# Patient Record
Sex: Female | Born: 1985 | Race: White | Hispanic: No | Marital: Married | Smoking: Never smoker
Health system: Southern US, Community
[De-identification: ages and names within clinical notes are randomized; demographics above are authoritative.]

## PROBLEM LIST (undated history)

## (undated) HISTORY — PX: APPENDECTOMY: SHX54

## (undated) HISTORY — PX: ABLATION: SHX5711

---

## 2013-05-15 ENCOUNTER — Encounter: Payer: Self-pay | Admitting: Family Medicine

## 2013-05-15 ENCOUNTER — Encounter (INDEPENDENT_AMBULATORY_CARE_PROVIDER_SITE_OTHER): Payer: Self-pay

## 2013-05-15 ENCOUNTER — Ambulatory Visit (INDEPENDENT_AMBULATORY_CARE_PROVIDER_SITE_OTHER): Payer: 59 | Admitting: Family Medicine

## 2013-05-15 VITALS — BP 106/73 | HR 88 | Ht 70.0 in | Wt 140.0 lb

## 2013-05-15 DIAGNOSIS — M545 Low back pain, unspecified: Secondary | ICD-10-CM

## 2013-05-15 DIAGNOSIS — M549 Dorsalgia, unspecified: Secondary | ICD-10-CM

## 2013-05-15 NOTE — Patient Instructions (Signed)
You have musculoskeletal low back pain, possibly a bulging disc on the right side. Take tylenol for baseline pain relief (1-2 extra strength tabs 3x/day) Other medications I'd advise against (could take a pain medicine if this is severe). No prednisone, aleve, ibuprofen products.  Some muscle relaxants can be considered but typically don't improve the course. Stay as active as possible. Do home exercises and stretches as directed - hold each for 20-30 seconds and do each one three times. Start physical therapy. Strengthening of low back muscles, abdominal musculature are key for long term pain relief. If not improving, will consider further imaging (MRI). Follow up with me in 6 weeks.

## 2013-05-17 ENCOUNTER — Ambulatory Visit: Payer: 59 | Attending: Family Medicine | Admitting: Physical Therapy

## 2013-05-17 DIAGNOSIS — IMO0001 Reserved for inherently not codable concepts without codable children: Secondary | ICD-10-CM | POA: Insufficient documentation

## 2013-05-17 DIAGNOSIS — M549 Dorsalgia, unspecified: Secondary | ICD-10-CM | POA: Insufficient documentation

## 2013-05-20 ENCOUNTER — Ambulatory Visit: Payer: 59 | Admitting: Physical Therapy

## 2013-05-20 ENCOUNTER — Encounter: Payer: Self-pay | Admitting: Family Medicine

## 2013-05-20 DIAGNOSIS — M545 Low back pain, unspecified: Secondary | ICD-10-CM | POA: Insufficient documentation

## 2013-05-20 NOTE — Progress Notes (Signed)
Patient ID: Ashley Whitaker, female   DOB: Feb 12, 1986, 28 y.o.   MRN: [id number]  PCP: No primary provider on file.  Subjective:   HPI: Patient is a 28 y.o. female here for low back pain.  Patient reports pain started about 2 years ago when she was pregnant with her daughter. Developed pain in lower back that would shoot up at times. Improved after delivery but not completely. Tried ice, heat, ibuprofen. Pain worse regardless of position. Had to stop running due to pain. Occasionally into right leg. Occasional numbness and tingling there also. No bowel/bladder dysfunction.  History reviewed. No pertinent past medical history.  No current outpatient prescriptions on file prior to visit.   No current facility-administered medications on file prior to visit.    Past Surgical History  Procedure Laterality Date  . Appendectomy      No Known Allergies  History   Social History  . Marital Status: Married    Spouse Name: N/A    Number of Children: N/A  . Years of Education: N/A   Occupational History  . Not on file.   Social History Main Topics  . Smoking status: Never Smoker   . Smokeless tobacco: Not on file  . Alcohol Use: Not on file  . Drug Use: Not on file  . Sexual Activity: Not on file   Other Topics Concern  . Not on file   Social History Narrative  . No narrative on file    Family History  Problem Relation Age of Onset  . Hyperlipidemia Father   . Hypertension Father   . Diabetes Neg Hx   . Heart attack Neg Hx   . Sudden death Neg Hx     BP 106/73  Pulse 88  Ht 5\' 10"  (1.778 m)  Wt 140 lb (63.504 kg)  BMI 20.09 kg/m2  Review of Systems: See HPI above.    Objective:  Physical Exam:  Gen: NAD  Back: No gross deformity, scoliosis. No TTP.  No midline or bony TTP. FROM with pain on flexion. Strength LEs 5/5 all muscle groups.   2+ MSRs in patellar and achilles tendons, equal bilaterally. Negative SLRs. Sensation intact to light  touch bilaterally. Negative logroll bilateral hips Negative fabers and piriformis stretches.    Assessment & Plan:  1. Low back pain - chronic.  Currently [redacted] weeks pregnant so medication options are limited.  Will not go ahead with radiographs either.  Start with physical therapy, tylenol as needed.  Consider MRI if not improving.  F/u in 4-6 weeks.

## 2013-05-20 NOTE — Assessment & Plan Note (Signed)
chronic.  Currently [redacted] weeks pregnant so medication options are limited.  Will not go ahead with radiographs either.  Start with physical therapy, tylenol as needed.  Consider MRI if not improving.  F/u in 4-6 weeks.

## 2013-05-22 ENCOUNTER — Encounter: Payer: 59 | Admitting: Physical Therapy

## 2013-05-24 ENCOUNTER — Encounter: Payer: 59 | Admitting: Physical Therapy

## 2013-05-27 ENCOUNTER — Encounter: Payer: 59 | Admitting: Physical Therapy

## 2013-05-29 ENCOUNTER — Encounter: Payer: 59 | Admitting: Physical Therapy

## 2013-05-31 ENCOUNTER — Encounter: Payer: 59 | Admitting: Physical Therapy

## 2013-06-03 ENCOUNTER — Encounter: Payer: 59 | Admitting: Physical Therapy

## 2013-06-05 ENCOUNTER — Encounter: Payer: 59 | Admitting: Physical Therapy

## 2013-06-07 ENCOUNTER — Encounter: Payer: 59 | Admitting: Physical Therapy

## 2013-06-10 ENCOUNTER — Encounter: Payer: 59 | Admitting: Physical Therapy

## 2013-06-12 ENCOUNTER — Encounter: Payer: 59 | Admitting: Physical Therapy

## 2013-06-13 ENCOUNTER — Encounter: Payer: 59 | Admitting: Physical Therapy

## 2013-06-17 ENCOUNTER — Encounter: Payer: 59 | Admitting: Physical Therapy

## 2013-06-19 ENCOUNTER — Encounter: Payer: 59 | Admitting: Physical Therapy

## 2013-06-21 ENCOUNTER — Encounter: Payer: 59 | Admitting: Physical Therapy

## 2013-07-01 ENCOUNTER — Ambulatory Visit: Payer: 59 | Admitting: Family Medicine

## 2016-12-12 ENCOUNTER — Ambulatory Visit (INDEPENDENT_AMBULATORY_CARE_PROVIDER_SITE_OTHER): Payer: 59 | Admitting: Family Medicine

## 2016-12-12 ENCOUNTER — Ambulatory Visit (HOSPITAL_BASED_OUTPATIENT_CLINIC_OR_DEPARTMENT_OTHER)
Admission: RE | Admit: 2016-12-12 | Discharge: 2016-12-12 | Disposition: A | Discharge status: Home / Self Care | Payer: 59 | Source: Ambulatory Visit | Attending: Family Medicine | Admitting: Family Medicine

## 2016-12-12 ENCOUNTER — Encounter: Payer: Self-pay | Admitting: Family Medicine

## 2016-12-12 VITALS — BP 103/68 | HR 67 | Ht 70.0 in | Wt 142.0 lb

## 2016-12-12 DIAGNOSIS — M79671 Pain in right foot: Secondary | ICD-10-CM | POA: Diagnosis not present

## 2016-12-12 NOTE — Patient Instructions (Signed)
You have an intermetatarsal sprain. Your x-rays and ultrasound are reassuring. These can take anywhere from a few days up to 6 weeks to resolve though I'd expect you to be better over the next 2-3 weeks. Postop shoe or cam walker when up and walking around for comfort - can switch to comfortable shoe when tolerated. Icing 15 minutes at a time 3-4 times a day. Tylenol and/or aleve as needed for pain. Follow up with me in 3-4 weeks if you're still having problems.

## 2016-12-14 DIAGNOSIS — C439 Malignant melanoma of skin, unspecified: Secondary | ICD-10-CM | POA: Insufficient documentation

## 2016-12-14 DIAGNOSIS — M79671 Pain in right foot: Secondary | ICD-10-CM | POA: Insufficient documentation

## 2016-12-14 DIAGNOSIS — N870 Mild cervical dysplasia: Secondary | ICD-10-CM | POA: Insufficient documentation

## 2016-12-14 NOTE — Assessment & Plan Note (Signed)
unusual mechanism.  Ordered and ndependently reviewed radiographs, performed and reviewed ultrasound - no evidence abnormalities.  Consistent with intermetatarsal sprain.  Icing, tylenol and/or aleve.  Postop shoe for comfort.  F/u in 3-4 weeks if not improving as expected.

## 2016-12-14 NOTE — Progress Notes (Signed)
PCP: Patient, No Pcp Per  Subjective:   HPI: Patient is a 31 y.o. female here for right foot pain.  Patient reports Sunday morning she went to stand up from seated position and felt a sharp pain on bottom of her right foot about the 1st and 2nd metatarsal head areas. 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 Pain level up to 4-5/10. She is limping, pain is sharp like stepping on a knife. She has been icing and taking ibuprofen which help. No skin changes, bruising, numbness. Some swelling however.  No past medical history on file.  No current outpatient prescriptions on file prior to visit.   No current facility-administered medications on file prior to visit.     Past Surgical History:  Procedure Laterality Date  . APPENDECTOMY      Allergies  Allergen Reactions  . Chlorophyll Hives, Nausea And Vomiting and Swelling  . Citric Acid Hives, Rash and Swelling  . Lidocaine   . Sulfa Antibiotics     Social History   Social History  . Marital status: Married    Spouse name: N/A  . Number of children: N/A  . Years of education: N/A   Occupational History  . Not on file.   Social History Main Topics  . Smoking status: Never Smoker  . Smokeless tobacco: Never Used  . Alcohol use Not on file  . Drug use: Unknown  . Sexual activity: Not on file   Other Topics Concern  . Not on file   Social History Narrative  . No narrative on file    Family History  Problem Relation Age of Onset  . Hyperlipidemia Father   . Hypertension Father   . Diabetes Neg Hx   . Heart attack Neg Hx   . Sudden death Neg Hx     BP 103/68   Pulse 67   Ht 5\' 10"  (1.778 m)   Wt 142 lb (64.4 kg)   LMP 12/07/2016   BMI 20.37 kg/m   Review of Systems: See HPI above.     Objective:  Physical Exam:  Gen: NAD, comfortable in exam room  Right foot/ankle: No gross deformity, swelling, ecchymoses FROM with pain on passive extension and  flexion of great toe. TTP plantar 1st, 2nd metatarsal heads and over sesamoids. Pain metatarsal squeeze. Negative ant drawer and talar tilt.   Negative syndesmotic compression. Thompsons test negative. NV intact distally.  Left foot/ankle: FROM without pain.   MSK u/s right foot:  No evidence cortical irregularity, edema.  No target sign or abnormalities of flexor hallucis tendon.  Assessment & Plan:  1. Right foot injury - unusual mechanism.  Ordered and ndependently reviewed radiographs, performed and reviewed ultrasound - no evidence abnormalities.  Consistent with intermetatarsal sprain.  Icing, tylenol and/or aleve.  Postop shoe for comfort.  F/u in 3-4 weeks if not improving as expected.

## 2017-05-25 ENCOUNTER — Ambulatory Visit (INDEPENDENT_AMBULATORY_CARE_PROVIDER_SITE_OTHER): Payer: 59 | Admitting: Family Medicine

## 2017-05-25 ENCOUNTER — Encounter: Payer: Self-pay | Admitting: Family Medicine

## 2017-05-25 DIAGNOSIS — M79672 Pain in left foot: Secondary | ICD-10-CM | POA: Diagnosis not present

## 2017-05-25 DIAGNOSIS — M79644 Pain in right finger(s): Secondary | ICD-10-CM

## 2017-05-25 DIAGNOSIS — M79645 Pain in left finger(s): Secondary | ICD-10-CM | POA: Diagnosis not present

## 2017-05-25 NOTE — Patient Instructions (Signed)
You have metatarsalgia of your left foot. Use metatarsal pads in your workout shoes. Avoid flat shoes, barefoot walking if this worsens your pain. If you have more problems on the right side just call and we can put the pad on the right too, no charge. Your ultrasound of the fingers is reassuring - consistent with scar tissue on the left, tenosynovitis on the right. Icing, ibuprofen or aleve if needed. Call me if the swelling worsens. Follow up with me as needed otherwise.

## 2017-05-28 ENCOUNTER — Encounter: Payer: Self-pay | Admitting: Family Medicine

## 2017-05-28 DIAGNOSIS — M79672 Pain in left foot: Secondary | ICD-10-CM | POA: Insufficient documentation

## 2017-05-28 DIAGNOSIS — M79644 Pain in right finger(s): Secondary | ICD-10-CM | POA: Insufficient documentation

## 2017-05-28 DIAGNOSIS — M79645 Pain in left finger(s): Secondary | ICD-10-CM

## 2017-05-28 NOTE — Assessment & Plan Note (Signed)
2/2 metatarsalgia.  MSK u/s reassuring.  Metatarsal pad placed in insert.  Avoid flat shoes, barefoot walking.  Tylenol and/or ibuprofen if needed.

## 2017-05-28 NOTE — Assessment & Plan Note (Signed)
consistent with scar tissue on left, small focal tenosynovitis on right.  Reassured.  Icing, ibuprofen or aleve if needed.

## 2017-05-28 NOTE — Progress Notes (Signed)
PCP: Patient, No Pcp Per  Subjective:   HPI: Patient is a 32 y.o. female here for left foot pain, finger pain.  Patient reports for the last few weeks she's had worsening plantar left foot pain in forefoot. Pain is currently 2/10 but up to 8-9/10 and sharp at times. Worse with running, step exercises. Taking ibuprofen which helps some. No skin changes, numbness. Also reporting she's noticed small knots on dorsal aspect of PIPs of right ring and left index fingers.  Pain is mild and 2-3/10. Worse with striking these areas on anything.  History reviewed. No pertinent past medical history.  Current Outpatient Medications on File Prior to Visit  Medication Sig Dispense Refill  . fluticasone (FLONASE) 50 MCG/ACT nasal spray fluticasone propionate 50 mcg/actuation nasal spray,suspension    . levonorgestrel (MIRENA, 52 MG,) 20 MCG/24HR IUD Mirena 20 mcg/24 hours (5 yrs) 52 mg intrauterine device  Take 1 device by intrauterine route.    . Multiple Vitamin (MULTIVITAMIN) tablet Take by mouth.     No current facility-administered medications on file prior to visit.     Past Surgical History:  Procedure Laterality Date  . APPENDECTOMY      Allergies  Allergen Reactions  . Chlorophyll Hives, Nausea And Vomiting and Swelling  . Citric Acid Hives, Rash and Swelling  . Lidocaine   . Sulfa Antibiotics     Social History   Socioeconomic History  . Marital status: Married    Spouse name: Not on file  . Number of children: Not on file  . Years of education: Not on file  . Highest education level: Not on file  Social Needs  . Financial resource strain: Not on file  . Food insecurity - worry: Not on file  . Food insecurity - inability: Not on file  . Transportation needs - medical: Not on file  . Transportation needs - non-medical: Not on file  Occupational History  . Not on file  Tobacco Use  . Smoking status: Never Smoker  . Smokeless tobacco: Never Used  Substance and Sexual  Activity  . Alcohol use: Not on file  . Drug use: Not on file  . Sexual activity: Not on file  Other Topics Concern  . Not on file  Social History Narrative  . Not on file    Family History  Problem Relation Age of Onset  . Hyperlipidemia Father   . Hypertension Father   . Diabetes Neg Hx   . Heart attack Neg Hx   . Sudden death Neg Hx     BP 113/86   Pulse 72   Ht 5\' 10"  (1.778 m)   Wt 150 lb (68 kg)   BMI 21.52 kg/m   Review of Systems: See HPI above.     Objective:  Physical Exam:  Gen: NAD, comfortable in exam room  Left foot/ankle: No gross deformity, swelling, ecchymoses FROM with 5/5 strength without pain on flexion/extension of ankle or digits. TTP plantar 2nd MT head.  No other tenderness. Negative ant drawer and talar tilt.   Negative syndesmotic compression. Negative metatarsal squeeze. Thompsons test negative. NV intact distally.  Right foot/ankle: No deformity. FROM with 5/5 strength. No tenderness to palpation. NVI distally.  Right 4th digit and left 2nd digit: No malrotation or angulation.  Small palpable nonmobile nodules noted dorsal aspect on right 4th PIP and left 2nd PIP.   Both areas mildly tender.  No erythema, warmth, synovitis. No movement of nodules with flexion/extension.   MSK u/s  left foot:  No cortical irregularities, edema overlying cortices of 1st-3rd metatarsals.    MSK u/s left index finger: no abnormalities in area of swelling.  MSK u/s right ring finger: small anechoic area in area of swelling within extensor tendon sheath consistent with tenosynovitis.  No neovascularity.  Assessment & Plan:  1. Left foot pain - 2/2 metatarsalgia.  MSK u/s reassuring.  Metatarsal pad placed in insert.  Avoid flat shoes, barefoot walking.  Tylenol and/or ibuprofen if needed.    2. Finger pain - consistent with scar tissue on left, small focal tenosynovitis on right.  Reassured.  Icing, ibuprofen or aleve if needed.

## 2017-12-05 ENCOUNTER — Encounter: Payer: Self-pay | Admitting: Family Medicine

## 2017-12-05 ENCOUNTER — Ambulatory Visit (INDEPENDENT_AMBULATORY_CARE_PROVIDER_SITE_OTHER): Payer: 59 | Admitting: Family Medicine

## 2017-12-05 VITALS — BP 111/78 | HR 63 | Ht 70.0 in | Wt 148.0 lb

## 2017-12-05 DIAGNOSIS — M25571 Pain in right ankle and joints of right foot: Secondary | ICD-10-CM | POA: Diagnosis not present

## 2017-12-05 DIAGNOSIS — G8929 Other chronic pain: Secondary | ICD-10-CM | POA: Diagnosis not present

## 2017-12-05 DIAGNOSIS — M25572 Pain in left ankle and joints of left foot: Secondary | ICD-10-CM | POA: Diagnosis not present

## 2017-12-05 NOTE — Patient Instructions (Signed)
You have ankle instability that's causing mild tendon strains about your ankles. Icing 15 minutes at a time, 3-4 times a day as needed Ibuprofen 3 tabs every 6 hours as needed with food. Tylenol 500mg  1-2 tabs every 6 hours as needed. You can take both of these about 30-60 minutes before the hike then again about halfway through. Start theraband strengthening exercises - once a day 3 sets of 10 - these are the most important part of your treatment. Laceup brace is a consideration but will try to do without this given your issues with metatarsalgia. Consider physical therapy for strengthening and balance exercises. Follow up in 5-6 weeks.

## 2017-12-06 ENCOUNTER — Encounter: Payer: Self-pay | Admitting: Family Medicine

## 2017-12-06 NOTE — Progress Notes (Signed)
PCP: Patient, No Pcp Per  Subjective:   HPI: Patient is a 32 y.o. female here for left ankle pain.  Patient reports she's had a couple weeks of fairly severe left ankle pain (a little pain on the right). She feels the pain is medial and goes to the posterior aspect of her ankle associated with slight swelling. Pain is 2 out of 10 at rest but up to 10 out of 10 with running and sharp. She has tried icing, ibuprofen, different shoes without much benefit. No skin changes or numbness.  History reviewed. No pertinent past medical history.  Current Outpatient Medications on File Prior to Visit  Medication Sig Dispense Refill  . fluticasone (FLONASE) 50 MCG/ACT nasal spray fluticasone propionate 50 mcg/actuation nasal spray,suspension    . levonorgestrel (MIRENA, 52 MG,) 20 MCG/24HR IUD Mirena 20 mcg/24 hours (5 yrs) 52 mg intrauterine device  Take 1 device by intrauterine route.    . Multiple Vitamin (MULTIVITAMIN) tablet Take by mouth.     No current facility-administered medications on file prior to visit.     Past Surgical History:  Procedure Laterality Date  . APPENDECTOMY      Allergies  Allergen Reactions  . Chlorophyll Hives, Nausea And Vomiting and Swelling  . Citric Acid Hives, Rash and Swelling  . Lidocaine   . Sulfa Antibiotics     Social History   Socioeconomic History  . Marital status: Married    Spouse name: Not on file  . Number of children: Not on file  . Years of education: Not on file  . Highest education level: Not on file  Occupational History  . Not on file  Social Needs  . Financial resource strain: Not on file  . Food insecurity:    Worry: Not on file    Inability: Not on file  . Transportation needs:    Medical: Not on file    Non-medical: Not on file  Tobacco Use  . Smoking status: Never Smoker  . Smokeless tobacco: Never Used  Substance and Sexual Activity  . Alcohol use: Not on file  . Drug use: Not on file  . Sexual activity: Not on  file  Lifestyle  . Physical activity:    Days per week: Not on file    Minutes per session: Not on file  . Stress: Not on file  Relationships  . Social connections:    Talks on phone: Not on file    Gets together: Not on file    Attends religious service: Not on file    Active member of club or organization: Not on file    Attends meetings of clubs or organizations: Not on file    Relationship status: Not on file  . Intimate partner violence:    Fear of current or ex partner: Not on file    Emotionally abused: Not on file    Physically abused: Not on file    Forced sexual activity: Not on file  Other Topics Concern  . Not on file  Social History Narrative  . Not on file    Family History  Problem Relation Age of Onset  . Hyperlipidemia Father   . Hypertension Father   . Diabetes Neg Hx   . Heart attack Neg Hx   . Sudden death Neg Hx     BP 111/78   Pulse 63   Ht 5\' 10"  (1.778 m)   Wt 148 lb (67.1 kg)   BMI 21.24 kg/m   Review  of Systems: See HPI above.     Objective:  Physical Exam:  Gen: NAD, comfortable in exam room  Left ankle: No gross deformity, swelling, ecchymoses FROM with 5/5 strength all directions TTP over ATFL, ant ankle joint primarily. 2+ ant drawer and 1+ talar tilt.   Negative syndesmotic compression. Thompsons test negative. NV intact distally.  Right ankle: No gross deformity, swelling, ecchymoses FROM with 5/5 strength all directions Mild TTP over ATFL, ant ankle joint primarily. 2+ ant drawer and 1+ talar tilt.   Negative syndesmotic compression. Thompsons test negative. NV intact distally.   Assessment & Plan:  1. Bilateral ankle pain - worse on left.  No acute injury.  No concerning findings on history or exam.  Consistent with ankle instability.  Icing, ibuprofen and/or tylenol.  Start theraband strengthening exercises which were shown today.  Consider ASO.  Consider physical therapy.  F/u in 5-6 weeks.

## 2018-01-09 ENCOUNTER — Encounter: Payer: Self-pay | Admitting: Family Medicine

## 2018-01-09 ENCOUNTER — Ambulatory Visit (INDEPENDENT_AMBULATORY_CARE_PROVIDER_SITE_OTHER): Payer: 59 | Admitting: Family Medicine

## 2018-01-09 VITALS — BP 102/76 | HR 67 | Ht 70.0 in | Wt 144.0 lb

## 2018-01-09 DIAGNOSIS — M25572 Pain in left ankle and joints of left foot: Secondary | ICD-10-CM

## 2018-01-09 DIAGNOSIS — M25571 Pain in right ankle and joints of right foot: Secondary | ICD-10-CM | POA: Diagnosis not present

## 2018-01-09 DIAGNOSIS — M7541 Impingement syndrome of right shoulder: Secondary | ICD-10-CM

## 2018-01-09 DIAGNOSIS — M76822 Posterior tibial tendinitis, left leg: Secondary | ICD-10-CM | POA: Diagnosis not present

## 2018-01-09 DIAGNOSIS — G8929 Other chronic pain: Secondary | ICD-10-CM | POA: Diagnosis not present

## 2018-01-09 NOTE — Progress Notes (Signed)
PCP: Patient, No Pcp Per  Subjective:   HPI: Patient is a 32 y.o. female here for left ankle pain and right shoulder pain. Today patient reports 2-3/10 left ankle pain.  She has been improving and ran a mud run 3 days ago.  She slightly reinjured her ankle during the run, inverted this.  She reports pain over the medial aspect of the ankle.  She has not taking any medications but has used ice.  She has noted improvement since time of injury.  She denies any new swelling, bruising, erythema.  No associated skin changes  Stress reports right shoulder pain.  She has a history of minor occasional pains in the shoulder from history of playing volleyball.  Her pain became worse also 3 days ago after the mud run.  Her pain is worse with general use or moving it overhead.  Also painful if she lays on that side.  She denies any numbness or tingling in the hand.  She has not noticed any significant weakness.  No past medical history on file.  Current Outpatient Medications on File Prior to Visit  Medication Sig Dispense Refill  . fluticasone (FLONASE) 50 MCG/ACT nasal spray fluticasone propionate 50 mcg/actuation nasal spray,suspension    . levonorgestrel (MIRENA, 52 MG,) 20 MCG/24HR IUD Mirena 20 mcg/24 hours (5 yrs) 52 mg intrauterine device  Take 1 device by intrauterine route.    . Multiple Vitamin (MULTIVITAMIN) tablet Take by mouth.     No current facility-administered medications on file prior to visit.     Past Surgical History:  Procedure Laterality Date  . APPENDECTOMY      Allergies  Allergen Reactions  . Chlorophyll Hives, Nausea And Vomiting and Swelling  . Citric Acid Hives, Rash and Swelling  . Lidocaine   . Sulfa Antibiotics     Social History   Socioeconomic History  . Marital status: Married    Spouse name: Not on file  . Number of children: Not on file  . Years of education: Not on file  . Highest education level: Not on file  Occupational History  . Not on file   Social Needs  . Financial resource strain: Not on file  . Food insecurity:    Worry: Not on file    Inability: Not on file  . Transportation needs:    Medical: Not on file    Non-medical: Not on file  Tobacco Use  . Smoking status: Never Smoker  . Smokeless tobacco: Never Used  Substance and Sexual Activity  . Alcohol use: Not on file  . Drug use: Not on file  . Sexual activity: Not on file  Lifestyle  . Physical activity:    Days per week: Not on file    Minutes per session: Not on file  . Stress: Not on file  Relationships  . Social connections:    Talks on phone: Not on file    Gets together: Not on file    Attends religious service: Not on file    Active member of club or organization: Not on file    Attends meetings of clubs or organizations: Not on file    Relationship status: Not on file  . Intimate partner violence:    Fear of current or ex partner: Not on file    Emotionally abused: Not on file    Physically abused: Not on file    Forced sexual activity: Not on file  Other Topics Concern  . Not on file  Social History Narrative  . Not on file    Family History  Problem Relation Age of Onset  . Hyperlipidemia Father   . Hypertension Father   . Diabetes Neg Hx   . Heart attack Neg Hx   . Sudden death Neg Hx     BP 102/76   Pulse 67   Ht 5\' 10"  (1.778 m)   Wt 144 lb (65.3 kg)   BMI 20.66 kg/m   Review of Systems: See HPI above.     Objective:  Physical Exam:  Gen: awake, alert, NAD, comfortable in exam room Pulm: breathing unlabored  Left ankle: - Inspection: No obvious deformity, erythema, swelling, or ecchymosis - Palpation: Mild tenderness along the course of the posterior tibialis tendon to its insertion.  Slight tenderness over the ATFL - Strength: Normal strength with dorsiflexion, plantarflexion, inversion, and eversion.  Mild pain with resisted inversion - ROM: Full ROM - Neuro/vasc: NV intact - Special Tests: 2+ anterior  drawer  Right shoulder: No obvious deformity or asymmetry. No bruising. No swelling No TTP. Specifically no TTP over [facility] joint Full ROM in flexion, abduction, internal/external rotation.  Pain with endrange flexion.  Pain from 90-180 degrees abduction NV intact distally Special Tests:  - Impingement: Positive Hawkins and Neers.  - Supraspinatous: Pain with empty can.  5/5 strength - Infraspinatous/Teres: 5/5 strength with ER.  Mild pain with testing - Subscapularis: negative belly press, negative bear hug. 5/5 strength with IR - Biceps tendon: Negative Speeds.  - Labrum: Negative Obriens.  [facility] Joint: Negative cross arm - Negative apprehension test  Left shoulder: No obvious deformity No tenderness Full range of motion with 5/5 strength with rotator cuff testing. N/V intact   Assessment & Plan:  1.  Left ankle pain- patient has a history of chronic ankle pain and instability.  Today her pain is consistent with posterior tibialis tendinitis.  Overall her symptoms are mild and she has improved over the last 3 days since time of injury. - She will continue home exercise for her ankle as previously instructed - NSAIDs as needed for pain - Continue ice  2.  Right shoulder pain- patient right shoulder pain most consistent with subacromial impingement/bursitis. - Patient instructed on rotator cuff and scapular stabilization exercises. - Aleve or ibuprofen scheduled for the next 7 to 10 days then as needed - Follow-up in about 1 month

## 2018-01-09 NOTE — Patient Instructions (Signed)
You have rotator cuff impingement Try to avoid painful activities (overhead activities, lifting with extended arm) as much as possible. Aleve 2 tabs twice a day with food OR ibuprofen 3 tabs three times a day with food for pain and inflammation - take for 7-10 days then as needed. Can take tylenol in addition to this. Subacromial injection may be beneficial to help with pain and to decrease inflammation. Consider physical therapy with transition to home exercise program. Wait a few days then do home exercise program with theraband and scapular stabilization exercises daily 3 sets of 10 once a day. If not improving at follow-up we will consider imaging, injection, physical therapy, and/or nitro patches. Follow up with me in 1 month.  You have strained your posterior tibialis tendon on the inside of your ankle. Aleve or ibuprofen as noted above. Icing 15 minutes at a time 3-4 times a day. Continue home exercises for this for another month.

## 2018-02-06 ENCOUNTER — Encounter: Payer: Self-pay | Admitting: Family Medicine

## 2018-02-06 ENCOUNTER — Ambulatory Visit (INDEPENDENT_AMBULATORY_CARE_PROVIDER_SITE_OTHER): Payer: 59 | Admitting: Family Medicine

## 2018-02-06 VITALS — BP 110/78 | HR 64 | Ht 70.0 in | Wt 144.0 lb

## 2018-02-06 DIAGNOSIS — M7541 Impingement syndrome of right shoulder: Secondary | ICD-10-CM

## 2018-02-06 DIAGNOSIS — M76822 Posterior tibial tendinitis, left leg: Secondary | ICD-10-CM | POA: Diagnosis not present

## 2018-02-06 NOTE — Progress Notes (Signed)
PCP: Patient, No Pcp Per  Subjective:   HPI: Patient is a 32 y.o. female here for left ankle pain and right shoulder pain.  10/29: Today patient reports 2-3/10 left ankle pain.  She has been improving and ran a mud run 3 days ago.  She slightly reinjured her ankle during the run, inverted this.  She reports pain over the medial aspect of the ankle.  She has not taking any medications but has used ice.  She has noted improvement since time of injury.  She denies any new swelling, bruising, erythema.  No associated skin changes  Stress reports right shoulder pain.  She has a history of minor occasional pains in the shoulder from history of playing volleyball.  Her pain became worse also 3 days ago after the mud run.  Her pain is worse with general use or moving it overhead.  Also painful if she lays on that side.  She denies any numbness or tingling in the hand.  She has not noticed any significant weakness.  11/26: Patient reports her ankle is much better - some soreness only with running medial ankle. Her right shoulder is mildly improved especially over past week. Pain 3/10 with reaching, overhead motions, sharp laterally. Doing home exercises but cannot do flexion and abduction exercises due to pain. Not needed medicine for over 2 weeks for shoulder. No skin changes, numbness.  History reviewed. No pertinent past medical history.  Current Outpatient Medications on File Prior to Visit  Medication Sig Dispense Refill  . fluticasone (FLONASE) 50 MCG/ACT nasal spray fluticasone propionate 50 mcg/actuation nasal spray,suspension    . levonorgestrel (MIRENA, 52 MG,) 20 MCG/24HR IUD Mirena 20 mcg/24 hours (5 yrs) 52 mg intrauterine device  Take 1 device by intrauterine route.    . metroNIDAZOLE (FLAGYL) 500 MG tablet metronidazole 500 mg tablet  Take 1 tablet every 8 hours by oral route for 7 days.    . Multiple Vitamin (MULTIVITAMIN) tablet Take by mouth.     No current  facility-administered medications on file prior to visit.     Past Surgical History:  Procedure Laterality Date  . APPENDECTOMY      Allergies  Allergen Reactions  . Chlorophyll Hives, Nausea And Vomiting and Swelling  . Citric Acid Hives, Rash and Swelling  . Lidocaine   . Sulfa Antibiotics     Social History   Socioeconomic History  . Marital status: Married    Spouse name: Not on file  . Number of children: Not on file  . Years of education: Not on file  . Highest education level: Not on file  Occupational History  . Not on file  Social Needs  . Financial resource strain: Not on file  . Food insecurity:    Worry: Not on file    Inability: Not on file  . Transportation needs:    Medical: Not on file    Non-medical: Not on file  Tobacco Use  . Smoking status: Never Smoker  . Smokeless tobacco: Never Used  Substance and Sexual Activity  . Alcohol use: Not on file  . Drug use: Not on file  . Sexual activity: Not on file  Lifestyle  . Physical activity:    Days per week: Not on file    Minutes per session: Not on file  . Stress: Not on file  Relationships  . Social connections:    Talks on phone: Not on file    Gets together: Not on file  Attends religious service: Not on file    Active member of club or organization: Not on file    Attends meetings of clubs or organizations: Not on file    Relationship status: Not on file  . Intimate partner violence:    Fear of current or ex partner: Not on file    Emotionally abused: Not on file    Physically abused: Not on file    Forced sexual activity: Not on file  Other Topics Concern  . Not on file  Social History Narrative  . Not on file    Family History  Problem Relation Age of Onset  . Hyperlipidemia Father   . Hypertension Father   . Diabetes Neg Hx   . Heart attack Neg Hx   . Sudden death Neg Hx     BP 110/78   Pulse 64   Ht 5\' 10"  (1.778 m)   Wt 144 lb (65.3 kg)   BMI 20.66 kg/m   Review  of Systems: See HPI above.     Objective:  Physical Exam:  Gen: NAD, comfortable in exam room  Left ankle: No gross deformity, swelling, ecchymoses FROM with 5/5 strength all motions. No TTP 2+ ant drawer and 1+ talar tilt.   Negative syndesmotic compression. Thompsons test negative. NV intact distally.  Right shoulder: No swelling, ecchymoses.  No gross deformity. No TTP. FROM with painful arc. Positive Hawkins, Neers. Negative Yergasons. Strength 5/5 with empty can and resisted internal/external rotation.  Pain empty can. Negative apprehension. NV intact distally.   Assessment & Plan:  1.  Left ankle pain- much improved with home exercises - continue these for a few weeks every other day.  2.  Right shoulder pain- 2/2 rotator cuff impingement, subacromial bursitis.  Aleve or ibuprofen.  Start physical therapy.  Consider nitro patches (has history of migraines), injection if not improving.  F/u in 6 weeks.

## 2018-02-06 NOTE — Patient Instructions (Signed)
You have rotator cuff impingement Try to avoid painful activities (overhead activities, lifting with extended arm) as much as possible. Aleve 2 tabs twice a day with food OR ibuprofen 3 tabs three times a day with food for pain and inflammation as needed. Can take tylenol in addition to this. Start physical therapy with transition to home exercise program. Consider nitro patches, injection if not improving. Follow up with me in 6 weeks.

## 2018-02-15 ENCOUNTER — Encounter: Payer: Self-pay | Admitting: Rehabilitative and Restorative Service Providers"

## 2018-02-15 ENCOUNTER — Ambulatory Visit (INDEPENDENT_AMBULATORY_CARE_PROVIDER_SITE_OTHER): Payer: 59 | Admitting: Rehabilitative and Restorative Service Providers"

## 2018-02-15 DIAGNOSIS — R29898 Other symptoms and signs involving the musculoskeletal system: Secondary | ICD-10-CM | POA: Diagnosis not present

## 2018-02-15 DIAGNOSIS — M25511 Pain in right shoulder: Secondary | ICD-10-CM | POA: Diagnosis not present

## 2018-02-15 DIAGNOSIS — R293 Abnormal posture: Secondary | ICD-10-CM

## 2018-02-15 NOTE — Patient Instructions (Addendum)
Myofacial ball release work using ~ 4 inch plastic ball   Axial Extension (Chin Tuck)    Pull chin in and lengthen back of neck. Hold __5__ seconds while counting out loud. Repeat __10__ times. Do __several__ sessions per day.  Shoulder Blade Squeeze    Rotate shoulders back, then squeeze shoulder blades together. Repeat ____ times. Do ____ sessions per day.  Upper Back Strength: Lower Trapezius / Rotator Cuff " L's "     Arms in waitress pose, palms up. Press hands back and slide shoulder blades down. Hold for __5__ seconds. Repeat _10___ times. 1-2 times per day.    Scapular Retraction: Elbow Flexion (Standing)  "W's"     With elbows bent to 90, pinch shoulder blades together and rotate arms out, keeping elbows bent. Repeat __10__ times per set. Do __1-2__ sets per session. Do _several ___ sessions per day.  Scapula Adduction With Pectoralis Stretch: Low - Standing   Shoulders at 45 hands even with shoulders, keeping weight through legs, shift weight forward until you feel pull or stretch through the front of your chest. Hold _30__ seconds. Do _3__ times, _2-4__ times per day.   Scapula Adduction With Pectoralis Stretch: Mid-Range - Standing   Shoulders at 90 elbows even with shoulders, keeping weight through legs, shift weight forward until you feel pull or strength through the front of your chest. Hold __30_ seconds. Do _3__ times, __2-4_ times per day.   Scapula Adduction With Pectoralis Stretch: High - Standing   Shoulders at 120 hands up high on the doorway, keeping weight on feet, shift weight forward until you feel pull or stretch through the front of your chest. Hold _30__ seconds. Do _3__ times, _2-3__ times per day.  SUPINE Tips A    Being in the supine position means to be lying on the back. Lying on the back is the position of least compression on the bones and discs of the spine, and helps to re-align the natural curves of the back. Hold 1-2  min working toward 5 min - no pain    TENS UNIT: This is helpful for muscle pain and spasm.   Search and Purchase a TENS 7000 2nd edition at www.tenspros.com. It should be less than $30.     TENS unit instructions: Do not shower or bathe with the unit on Turn the unit off before removing electrodes or batteries If the electrodes lose stickiness add a drop of water to the electrodes after they are disconnected from the unit and place on plastic sheet. If you continued to have difficulty, call the TENS unit company to purchase more electrodes. Do not apply lotion on the skin area prior to use. Make sure the skin is clean and dry as this will help prolong the life of the electrodes. After use, always check skin for unusual red areas, rash or other skin difficulties. If there are any skin problems, does not apply electrodes to the same area. Never remove the electrodes from the unit by pulling the wires. Do not use the TENS unit or electrodes other than as directed. Do not change electrode placement without consultating your therapist or physician. Keep 2 fingers with between each electrode.      [facility] [place], [place] [address]  [phone] (office) [phone] (fax)

## 2018-02-15 NOTE — Therapy (Signed)
[place], [place], [address] Phone: [phone]   Fax:  [phone]  Physical Therapy Evaluation  Patient Details  Name: Ashley Whitaker MRN: [id number] Date of Birth: 11-21-1985 Referring Provider (PT): Dr Karlton Lemon    Encounter Date: 02/15/2018  PT End of Session - 02/15/18 1536    Visit Number  1    Number of Visits  12    Date for PT Re-Evaluation  03/29/18    PT Start Time  1950    PT Stop Time  1640    PT Time Calculation (min)  66 min    Activity Tolerance  Patient tolerated treatment well       History reviewed. No pertinent past medical history.  Past Surgical History:  Procedure Laterality Date  . APPENDECTOMY      There were no vitals filed for this visit.   Subjective Assessment - 02/15/18 1542    Subjective  Patient reports noticing pain in the shoulder after a mud run 10/19. She noticed pain in the Rt shoulder; Lt ankle pain following event. Symptoms persisted but she is doing some better now. She continues have some pain limiting functional activities     Pertinent History  unmarkable per pt report - snowboard accident with lt shd dislocation and cervical vertebrae injury early 200's - fully resolved     Patient Stated Goals  get rid of pain and prevent furture injury     Currently in Pain?  No/denies    Pain Score  0-No pain    Pain Location  Shoulder    Pain Orientation  Right    Pain Descriptors / Indicators  Aching;Dull   some movements - sharp    Pain Type  Acute pain    Pain Onset  More than a month ago    Pain Frequency  Intermittent    Aggravating Factors   certain movements - reaching behind; reaching up; reaching behind back or up and back     Pain Relieving Factors  resting position with arm at side          [facility] PT Assessment - 02/15/18 0001      Assessment   Medical Diagnosis  Rt shoulder dysfunction     Referring Provider (PT)  Dr Karlton Lemon      Onset Date/Surgical Date  01/05/18    Hand Dominance  Right    Next MD Visit  1/20    Prior Therapy  yes none for Rt shoulder       Precautions   Precautions  None      Balance Screen   Has the patient fallen in the past 6 months  No    Has the patient had a decrease in activity level because of a fear of falling?   No    Is the patient reluctant to leave their home because of a fear of falling?   No      Prior Function   Level of Independence  Independent    Vocation  Full time employment    Vocation Requirements  comupter/desk     Leisure  mud runs; kids; household chores; reading      Observation/Other Assessments   Focus on Therapeutic Outcomes (FOTO)   34% limitation       Sensation   Additional Comments  some tingling into the Rt arm initially - resolved now       Posture/Postural Control   Posture Comments  head forward; shoudlers rounded; scapulae abducted and rotated along the thoracic wall       AROM   Right Shoulder Extension  54 Degrees    Right Shoulder Flexion  120 Degrees    Right Shoulder ABduction  110 Degrees    Right Shoulder Internal Rotation  30 Degrees    Right Shoulder External Rotation  80 Degrees    Left Shoulder Extension  62 Degrees    Left Shoulder Flexion  153 Degrees    Left Shoulder ABduction  157 Degrees    Left Shoulder Internal Rotation  54 Degrees    Left Shoulder External Rotation  95 Degrees    Cervical Flexion  48    Cervical Extension  40    Cervical - Right Side Bend  31    Cervical - Left Side Bend  30    Cervical - Right Rotation  60    Cervical - Left Rotation  52      Strength   Right Shoulder Flexion  5/5    Right Shoulder ABduction  5/5    Right Shoulder External Rotation  --   5-/5   Left Shoulder Flexion  5/5    Left Shoulder ABduction  5/5    Left Shoulder External Rotation  5/5      Palpation   Spinal mobility  hypomobility and tightness/tenderness cervical and thoracic spine with CPA and lateral mobs      Palpation comment  muscular tightness through the ant/lat/post cervical spine musculatur; pecs; upper trap; leveator; teres; biceps tendon Rt > Lt       Special Tests   Other special tests  (+) neural tension tests bilat UE's (Rt > Lt )                Objective measurements completed on examination: See above findings.      [facility] Adult PT Treatment/Exercise - 02/15/18 0001      Shoulder Exercises: Standing   Other Standing Exercises  axial extension 10 sec x 5; scap retraction 10 x 10; L's x 10; W's x10      Shoulder Exercises: Stretch   Other Shoulder Stretches  snow angel 1-2 min UE's at ~ 75 deg abduction     Other Shoulder Stretches  lower doorway stretch 30 sec x 2 - pain with mid level positon; did not try higher level       Moist Heat Therapy   Number Minutes Moist Heat  20 Minutes    Moist Heat Location  Cervical;Shoulder   Rt     Electrical Stimulation   Electrical Stimulation Location  Rt shoulder girdle     Electrical Stimulation Action  IFC    Electrical Stimulation Parameters  to tolerance    Electrical Stimulation Goals  Pain;Tone             PT Education - 02/15/18 1624    Education Details  HEP     Person(s) Educated  Patient    Methods  Explanation;Demonstration;Tactile cues;Handout;Verbal cues    Comprehension  Verbalized understanding;Returned demonstration;Verbal cues required;Tactile cues required          PT Long Term Goals - 02/15/18 1635      PT LONG TERM GOAL #1   Title  Improve posture and alignment with patient to demonstrated improve upright posture with posterior shoulder girdle engaged 03/29/18    Time  6    Period  Weeks    Status  New  PT LONG TERM GOAL #2   Title  Decrease pain with patient reporting ability to reach overhead and behind back without pain 03/29/18    Time  6    Period  Weeks    Status  New      PT LONG TERM GOAL #3   Title  Increase AROM Rt shoulder to equal or greater than AROM Lt shoulder  03/29/18    Time  6    Period  Weeks    Status  New      PT LONG TERM GOAL #4   Title  Independent in HEP 03/29/18    Time  6    Period  Weeks    Status  New      PT LONG TERM GOAL #5   Title  Improve FOTO to </= 22% limitation 03/29/18    Time  6    Period  Weeks    Status  New             Plan - 02/15/18 1631    Clinical Impression Statement  Ashley Whitaker presents with resolving Rt shoulder dysfunction. She has poor posture and alignment; scapular dyskinesis; poor scapular control with Rt UE movement; decreased cervical and shoulder ROM/mobility; muscular tightness to palpation; positive neural tension testing; pain with functional activities. Patient will benefit form PT to address problems identified.     History and Personal Factors relevant to plan of care:  injury to Lt shoulder and cervical spine yrs ago     Clinical Presentation  Stable    Clinical Decision Making  Low    Rehab Potential  Good    PT Frequency  2x / week    PT Duration  6 weeks    PT Treatment/Interventions  Patient/family education;ADLs/Self Care Home Management;Cryotherapy;Electrical Stimulation;Iontophoresis 4mg /ml Dexamethasone;Moist Heat;Ultrasound;Dry needling;Manual techniques;Neuromuscular re-education;Therapeutic activities;Therapeutic exercise    PT Next Visit Plan  review HEP; add manual work; progress with stretchig; posterior shoulder girdle strengtheing; neuromuscular re-education/posterior shoulder girdle facilitation; modalities as indicated     Consulted and Agree with Plan of Care  Patient       Patient will benefit from skilled therapeutic intervention in order to improve the following deficits and impairments:  Postural dysfunction, Improper body mechanics, Pain, Increased fascial restricitons, Increased muscle spasms, Hypomobility, Decreased mobility, Decreased range of motion, Decreased strength, Decreased activity tolerance  Visit Diagnosis: Acute pain of right shoulder - Plan: PT  plan of care cert/re-cert  Other symptoms and signs involving the musculoskeletal system - Plan: PT plan of care cert/re-cert  Abnormal posture - Plan: PT plan of care cert/re-cert     Problem List Patient Active Problem List   Diagnosis Date Noted  . Left foot pain 05/28/2017  . Pain in finger of both hands 05/28/2017  . Cervical intraepithelial neoplasia grade 1 12/14/2016  . Malignant melanoma ([place]) 12/14/2016  . Right foot pain 12/14/2016  . Low back pain 05/20/2013    Haytham Maher Nilda Simmer PT, MPH  02/15/2018, 4:39 PM  [facility] [place], [place], [address] Phone: [phone]   Fax:  [phone]  Name: Ashley Whitaker MRN: [id number] Date of Birth: 07-01-1985

## 2018-02-20 ENCOUNTER — Encounter: Payer: 59 | Admitting: Physical Therapy

## 2018-02-26 ENCOUNTER — Encounter: Payer: Self-pay | Admitting: Rehabilitative and Restorative Service Providers"

## 2018-02-26 ENCOUNTER — Ambulatory Visit (INDEPENDENT_AMBULATORY_CARE_PROVIDER_SITE_OTHER): Payer: 59 | Admitting: Rehabilitative and Restorative Service Providers"

## 2018-02-26 DIAGNOSIS — R29898 Other symptoms and signs involving the musculoskeletal system: Secondary | ICD-10-CM | POA: Diagnosis not present

## 2018-02-26 DIAGNOSIS — M25511 Pain in right shoulder: Secondary | ICD-10-CM

## 2018-02-26 DIAGNOSIS — R293 Abnormal posture: Secondary | ICD-10-CM | POA: Diagnosis not present

## 2018-02-26 NOTE — Therapy (Signed)
[place], [place], [address] Phone: [phone]   Fax:  [phone]  Physical Therapy Treatment  Patient Details  Name: Ashley Whitaker MRN: [id number] Date of Birth: 1985-05-24 Referring Provider (PT): Dr Karlton Lemon    Encounter Date: 02/26/2018  PT End of Session - 02/26/18 1600    Visit Number  2    Number of Visits  12    Date for PT Re-Evaluation  03/29/18    PT Start Time  1600    PT Stop Time  1655    PT Time Calculation (min)  55 min    Activity Tolerance  Patient tolerated treatment well       History reviewed. No pertinent past medical history.  Past Surgical History:  Procedure Laterality Date  . APPENDECTOMY      There were no vitals filed for this visit.  Subjective Assessment - 02/26/18 1601    Subjective  Patient reports that the pain maybe a little better. She notices pain when she is doing stretches. She is having much pain with functioinal activities. Doorway stretch hurts     Currently in Pain?  No/denies         [facility] PT Assessment - 02/26/18 0001      Assessment   Medical Diagnosis  Rt shoulder dysfunction     Referring Provider (PT)  Dr Karlton Lemon     Onset Date/Surgical Date  01/05/18    Hand Dominance  Right    Next MD Visit  1/20    Prior Therapy  yes none for Rt shoulder       Palpation   Spinal mobility  hypomobility and tightness/tenderness cervical and thoracic spine with CPA and lateral mobs     Palpation comment  muscular tightness through the ant/lat/post cervical spine musculatur; pecs; upper trap; leveator; teres; biceps tendon Rt > Lt       Special Tests   Other special tests  (+) neural tension tests bilat UE's (Rt > Lt )                   [facility] Adult PT Treatment/Exercise - 02/26/18 0001      Shoulder Exercises: Supine   Other Supine Exercises  neural mobilization Rt/Lt 60 sec x 2 each side PT assist elbow ext from 45  deg to 25-30 deg ext with reps before symptoms occurred       Shoulder Exercises: Standing   Extension  Strengthening;Both;10 reps;Theraband    Theraband Level (Shoulder Extension)  Level 2 (Red)    Row  Strengthening;Both;10 reps;Theraband    Theraband Level (Shoulder Row)  Level 2 (Red)    Retraction  Strengthening;5 reps;10 reps;Theraband   avoiding pain    Theraband Level (Shoulder Retraction)  Level 1 (Yellow)    Other Standing Exercises  axial extension 10 sec x 5; scap retraction 10 x 10; L's x 10; W's x10      Shoulder Exercises: Stretch   Corner Stretch Limitations  painful     Star Gazer Stretch  --   trunk rotation in supine in snow angle position 30 sec x 3   Other Shoulder Stretches  snow angel 1-2 min UE's at ~ 85 deg abduction - added knee roll to Lt 30 sec x 3     Other Shoulder Stretches  painful - trial of corner stretch also painful       Moist Heat Therapy   Number Minutes Moist Heat  15 Minutes    Moist Heat Location  Cervical;Shoulder   Rt     Electrical Stimulation   Electrical Stimulation Location  Rt cervical spine to Rt shoulder girdle     Electrical Stimulation Action  IFC    Electrical Stimulation Parameters  to tolerance    Electrical Stimulation Goals  Pain;Tone      Manual Therapy   Manual therapy comments  pt supine     Joint Mobilization  cervical CPA mobs     Soft tissue mobilization  deep tissue work through the Rt > Lt posterior lateral cervical spine into the Rt shoulder girdle esp pecs and anterior shoulder     Myofascial Release  cervical traction through treatment 20-30 sec x 3 reps              PT Education - 02/26/18 1650    Education Details  HEP     Person(s) Educated  Patient    Methods  Explanation;Demonstration;Tactile cues;Verbal cues;Handout    Comprehension  Verbalized understanding;Returned demonstration;Verbal cues required;Tactile cues required          PT Long Term Goals - 02/15/18 1635      PT LONG TERM  GOAL #1   Title  Improve posture and alignment with patient to demonstrated improve upright posture with posterior shoulder girdle engaged 03/29/18    Time  6    Period  Weeks    Status  New      PT LONG TERM GOAL #2   Title  Decrease pain with patient reporting ability to reach overhead and behind back without pain 03/29/18    Time  6    Period  Weeks    Status  New      PT LONG TERM GOAL #3   Title  Increase AROM Rt shoulder to equal or greater than AROM Lt shoulder 03/29/18    Time  6    Period  Weeks    Status  New      PT LONG TERM GOAL #4   Title  Independent in HEP 03/29/18    Time  6    Period  Weeks    Status  New      PT LONG TERM GOAL #5   Title  Improve FOTO to </= 22% limitation 03/29/18    Time  6    Period  Weeks    Status  New            Plan - 02/26/18 1600    Clinical Impression Statement  Improved functional activity level with decreased pain. Pt reports pain with any effort to stretch through the doorway or corner. Added supine trunk rotation stretch as well as neural mobilization and posterior shoulder girdle strengthening. Responded well to manual work and modalities. Progressing toward goals of rehab.     Rehab Potential  Good    PT Frequency  2x / week    PT Duration  6 weeks    PT Treatment/Interventions  Patient/family education;ADLs/Self Care Home Management;Cryotherapy;Electrical Stimulation;Iontophoresis 4mg /ml Dexamethasone;Moist Heat;Ultrasound;Dry needling;Manual techniques;Neuromuscular re-education;Therapeutic activities;Therapeutic exercise    PT Next Visit Plan  review HEP; add manual work; progress with stretchig; posterior shoulder girdle strengtheing; neuromuscular re-education/posterior shoulder girdle facilitation; modalities as indicated     Consulted and Agree with Plan of Care  Patient       Patient will benefit from skilled therapeutic intervention in order to improve the following deficits and impairments:  Postural dysfunction,  Improper body mechanics, Pain,  Increased fascial restricitons, Increased muscle spasms, Hypomobility, Decreased mobility, Decreased range of motion, Decreased strength, Decreased activity tolerance  Visit Diagnosis: Acute pain of right shoulder  Other symptoms and signs involving the musculoskeletal system  Abnormal posture     Problem List Patient Active Problem List   Diagnosis Date Noted  . Left foot pain 05/28/2017  . Pain in finger of both hands 05/28/2017  . Cervical intraepithelial neoplasia grade 1 12/14/2016  . Malignant melanoma ([place]) 12/14/2016  . Right foot pain 12/14/2016  . Low back pain 05/20/2013    Ricarda Atayde Nilda Simmer PT, MPH 02/26/2018, 4:58 PM  [facility] [place], [place], [address] Phone: [phone]   Fax:  [phone]  Name: Valbona Slabach MRN: [id number] Date of Birth: [birth year]

## 2018-02-26 NOTE — Patient Instructions (Addendum)
Resisted External Rotation: in Neutral - Bilateral   PALMS UP Sit or stand, tubing in both hands, elbows at sides, bent to 90, forearms forward. Pinch shoulder blades together and rotate forearms out. Keep elbows at sides. Repeat __10__ times per set. Do _2-3___ sets per session. Do _2-3___ sessions per day.   Low Row: Standing   Face anchor, feet shoulder width apart. Palms up, pull arms back, squeezing shoulder blades down and back . Repeat 10__ times per set. Do 2-3__ sets per session. Do 1_ sessions per week. Anchor Height: Waist   Strengthening: Resisted Extension   Hold tubing in right hand, arm forward. Pull arm back, elbow straight. Repeat _10___ times per set. Do 2-3____ sets per session. Do 1__ sessions per day.  Neurovascular: Median Nerve Stretch - Supine NO PAIN     Lie with neck supported, side-bent away from moving arm. Hold right arm out to side, elbow bent, thumb down, fingers and wrist bent back. Slowly straighten elbowas far as possible without pain. Hold for __60__ seconds. Repeat __2__ times per set. Do __2__ sets per day   Lower Trunk Rotation Stretch    Keeping back flat and feet together arms out to side at 80-90 deg - working gradually toward 100-120 deg, rotate knees to left side. Hold __20-30__ seconds. Repeat __2-3__ times per set. Do _2-3___ sessions per day.

## 2018-03-01 ENCOUNTER — Ambulatory Visit (INDEPENDENT_AMBULATORY_CARE_PROVIDER_SITE_OTHER): Payer: 59 | Admitting: Rehabilitative and Restorative Service Providers"

## 2018-03-01 ENCOUNTER — Encounter: Payer: Self-pay | Admitting: Rehabilitative and Restorative Service Providers"

## 2018-03-01 DIAGNOSIS — R293 Abnormal posture: Secondary | ICD-10-CM

## 2018-03-01 DIAGNOSIS — M25511 Pain in right shoulder: Secondary | ICD-10-CM

## 2018-03-01 DIAGNOSIS — R29898 Other symptoms and signs involving the musculoskeletal system: Secondary | ICD-10-CM

## 2018-03-01 NOTE — Therapy (Signed)
Ashley Whitaker [place], [place], [address] Phone: [phone]   Fax:  [phone]  Physical Therapy Treatment  Patient Details  Name: Ashley Whitaker MRN: [id number] Date of Birth: March 12, 1986 Referring Provider (PT): Dr Karlton Lemon    Encounter Date: 03/01/2018  PT End of Session - 03/01/18 1541    Visit Number  3    Number of Visits  12    Date for PT Re-Evaluation  03/29/18    PT Start Time  1540    PT Stop Time  0867    PT Time Calculation (min)  51 min    Activity Tolerance  Patient tolerated treatment well       History reviewed. No pertinent past medical history.  Past Surgical History:  Procedure Laterality Date  . APPENDECTOMY      There were no vitals filed for this visit.  Subjective Assessment - 03/01/18 1542    Subjective  continued improvement - did well with last treatment. Less tingling - having some tingling in the anterior Rt shoulder.     Currently in Pain?  No/denies                       [facility] Adult PT Treatment/Exercise - 03/01/18 0001      Shoulder Exercises: Supine   Other Supine Exercises  neural mobilization Rt/Lt 60 sec x 2 each side PT assist elbow ext from 45 deg to 25-30 deg ext with reps before symptoms occurred     Other Supine Exercises  scap squeeze 5 sec x 10       Shoulder Exercises: Seated   Other Seated Exercises  scapular depression pressing into ball hild 5 sec x 10       Shoulder Exercises: Standing   Other Standing Exercises  axial extension 10 sec x 5; scap retraction 10 x 10; L's x 10; W's x10    Other Standing Exercises  reverse wal press 5 sec x 10- pain with press up       Shoulder Exercises: Stretch   Other Shoulder Stretches  snow angel 1-2 min UE's at ~ 85 deg abduction - added knee roll to Lt 30 sec x 3     Other Shoulder Stretches  nerve stretch 60 sec x 2 reps       Iontophoresis   Type of Iontophoresis  Dexamethasone     Location  anterior Rt shoulder     Dose  120 mAmp    Time  12 hours       Manual Therapy   Manual therapy comments  pt supine     Joint Mobilization  cervical CPA mobs     Soft tissue mobilization  deep tissue work through the Rt > Lt posterior lateral cervical spine into the Rt shoulder girdle esp pecs and anterior shoulder     Myofascial Release  cervical traction through treatment 20-30 sec x 3 reps     Passive ROM  cervical stretch into flexion; flexion with slight rotation; scaleni stretches x1 each direction 20-30 sec hold              PT Education - 03/01/18 1554    Education Details  HEP ionto     Person(s) Educated  Patient    Methods  Explanation;Demonstration;Tactile cues;Verbal cues;Handout    Comprehension  Verbalized understanding;Returned demonstration;Verbal cues required;Tactile cues required          PT Long Term Goals -  02/15/18 1635      PT LONG TERM GOAL #1   Title  Improve posture and alignment with patient to demonstrated improve upright posture with posterior shoulder girdle engaged 03/29/18    Time  6    Period  Weeks    Status  New      PT LONG TERM GOAL #2   Title  Decrease pain with patient reporting ability to reach overhead and behind back without pain 03/29/18    Time  6    Period  Weeks    Status  New      PT LONG TERM GOAL #3   Title  Increase AROM Rt shoulder to equal or greater than AROM Lt shoulder 03/29/18    Time  6    Period  Weeks    Status  New      PT LONG TERM GOAL #4   Title  Independent in HEP 03/29/18    Time  6    Period  Weeks    Status  New      PT LONG TERM GOAL #5   Title  Improve FOTO to </= 22% limitation 03/29/18    Time  6    Period  Weeks    Status  New            Plan - 03/01/18 1541    Clinical Impression Statement  Gradual progress with less pain and improved tolerance for stretching. Patient reports that she has been doing her exercises at home. She has imoproved neural tension test range. She  has continued tightness and pain anterior Rt shoulder - tight biceps. Some pain with reverse wall push up - needs to continue to work on posterior shoulder girdle strengthening. Responds well to manual work and did well with passive stretch today.     Rehab Potential  Good    PT Frequency  2x / week    PT Duration  6 weeks    PT Treatment/Interventions  Patient/family education;ADLs/Self Care Home Management;Cryotherapy;Electrical Stimulation;Iontophoresis 4mg /ml Dexamethasone;Moist Heat;Ultrasound;Dry needling;Manual techniques;Neuromuscular re-education;Therapeutic activities;Therapeutic exercise    PT Next Visit Plan  review HEP; add manual work; progress with stretchig; posterior shoulder girdle strengtheing; neuromuscular re-education/posterior shoulder girdle facilitation; modalities as indicated     Consulted and Agree with Plan of Care  Patient       Patient will benefit from skilled therapeutic intervention in order to improve the following deficits and impairments:  Postural dysfunction, Improper body mechanics, Pain, Increased fascial restricitons, Increased muscle spasms, Hypomobility, Decreased mobility, Decreased range of motion, Decreased strength, Decreased activity tolerance  Visit Diagnosis: Acute pain of right shoulder  Other symptoms and signs involving the musculoskeletal system  Abnormal posture     Problem List Patient Active Problem List   Diagnosis Date Noted  . Left foot pain 05/28/2017  . Pain in finger of both hands 05/28/2017  . Cervical intraepithelial neoplasia grade 1 12/14/2016  . Malignant melanoma ([place]) 12/14/2016  . Right foot pain 12/14/2016  . Low back pain 05/20/2013    [name] 03/01/2018, 5:14 PM  [facility] [place], [place], [address] Phone: [phone]   Fax:  [phone]  Name: Ashley Whitaker MRN: [id number] Date of Birth: June 28, 1985

## 2018-03-01 NOTE — Patient Instructions (Addendum)
   IONTOPHORESIS PATIENT PRECAUTIONS & CONTRAINDICATIONS:  . Redness under one or both electrodes can occur.  This characterized by a uniform redness that usually disappears within 12 hours of treatment. . Small pinhead size blisters may result in response to the drug.  Contact your physician if the problem persists more than 24 hours. . On rare occasions, iontophoresis therapy can result in temporary skin reactions such as rash, inflammation, irritation or burns.  The skin reactions may be the result of individual sensitivity to the ionic solution used, the condition of the skin at the start of treatment, reaction to the materials in the electrodes, allergies or sensitivity to dexamethasone, or a poor connection between the patch and your skin.  Discontinue using iontophoresis if you have any of these reactions and report to your therapist. . Remove the Patch or electrodes if you have any undue sensation of pain or burning during the treatment and report discomfort to your therapist. . Tell your Therapist if you have had known adverse reactions to the application of electrical current. . If using the Patch, the LED light will turn off when treatment is complete and the patch can be removed.  Approximate treatment time is 1-3 hours.  Remove the patch when light goes off or after 6 hours. . The Patch can be worn during normal activity, however excessive motion where the electrodes have been placed can cause poor contact between the skin and the electrode or uneven electrical current resulting in greater risk of skin irritation. Marland Kitchen Keep out of the reach of children.   . DO NOT use if you have a cardiac pacemaker or any other electrically sensitive implanted device. . DO NOT use if you have a known sensitivity to dexamethasone. . DO NOT use during Magnetic Resonance Imaging (MRI). . DO NOT use over broken or compromised skin (e.g. sunburn, cuts, or acne) due to the increased risk of skin  reaction. . DO NOT SHAVE over the area to be treated:  To establish good contact between the Patch and the skin, excessive hair may be clipped. . DO NOT place the Patch or electrodes on or over your eyes, directly over your heart, or brain. . DO NOT reuse the Patch or electrodes as this may cause burns to occur. ELBOW: Biceps - Standing    Standing in doorway, place one hand on wall, elbow straight. Lean forward. Hold _30__ seconds. _3__ reps per set, _2 times per day .    Reverse wall press  5 sec x 10  Gradually increase to 10 sec x 10 to 20 reps    Press down into ball or bed/sofa  Hold 5 sec x 10 reps  increase to 10 sec hold x 10-20 reps   Neurovascular: Median Nerve Stretch - Supine NO PAIN     Lie with neck supported, side-bent away from moving arm. Hold right arm out to side, elbow bent, thumb down, fingers and wrist bent back. Slowly straighten elbowas far as possible without pain. Hold for __60__ seconds. Repeat __2__ times per set. Do __2__ sets per day

## 2018-03-05 ENCOUNTER — Encounter: Payer: Self-pay | Admitting: Rehabilitative and Restorative Service Providers"

## 2018-03-05 ENCOUNTER — Ambulatory Visit (INDEPENDENT_AMBULATORY_CARE_PROVIDER_SITE_OTHER): Payer: 59 | Admitting: Rehabilitative and Restorative Service Providers"

## 2018-03-05 DIAGNOSIS — M25511 Pain in right shoulder: Secondary | ICD-10-CM | POA: Diagnosis not present

## 2018-03-05 DIAGNOSIS — R293 Abnormal posture: Secondary | ICD-10-CM

## 2018-03-05 DIAGNOSIS — R29898 Other symptoms and signs involving the musculoskeletal system: Secondary | ICD-10-CM | POA: Diagnosis not present

## 2018-03-05 NOTE — Therapy (Signed)
[place], [place], [address] Phone: [phone]   Fax:  [phone]  Physical Therapy Treatment  Patient Details  Name: Ashley Whitaker MRN: [id number] Date of Birth: Feb 14, 1986 Referring Provider (PT): Dr Karlton Lemon    Encounter Date: 03/05/2018  PT End of Session - 03/05/18 1433    Visit Number  4    Number of Visits  12    Date for PT Re-Evaluation  03/29/18    PT Start Time  2671    PT Stop Time  1525    PT Time Calculation (min)  52 min    Activity Tolerance  Patient tolerated treatment well       History reviewed. No pertinent past medical history.  Past Surgical History:  Procedure Laterality Date  . APPENDECTOMY      There were no vitals filed for this visit.  Subjective Assessment - 03/05/18 1639    Subjective  Pt reports flare up of symptoms in Lt UE with tingling nerve sensation noted frequently - nerve stretch "hurts". Pt also reports little crackling/popping sensations in the anterior Rt shoulder which have been more noticable in the past few days - reports that the doorway stretch "hurts".     Currently in Pain?  Yes    Pain Score  1     Pain Location  Shoulder    Pain Orientation  Right    Pain Descriptors / Indicators  Aching;Dull    Pain Type  Acute pain    Pain Radiating Towards  pain noted in the anterior Rt shoulder "popping" sensation     Pain Onset  More than a month ago    Pain Frequency  Intermittent         [facility] PT Assessment - 03/05/18 0001      Assessment   Medical Diagnosis  Rt shoulder dysfunction     Referring Provider (PT)  Dr Karlton Lemon     Onset Date/Surgical Date  01/05/18    Hand Dominance  Right    Next MD Visit  1/20    Prior Therapy  yes none for Rt shoulder       AROM   Right Shoulder Extension  57 Degrees    Right Shoulder Flexion  147 Degrees    Right Shoulder ABduction  149 Degrees    Right Shoulder Internal Rotation  30  Degrees    Right Shoulder External Rotation  90 Degrees    Left Shoulder Extension  55 Degrees    Left Shoulder Flexion  155 Degrees    Left Shoulder ABduction  157 Degrees    Left Shoulder Internal Rotation  54 Degrees    Left Shoulder External Rotation  95 Degrees      Palpation   Palpation comment  muscular tightness through the ant/lat/post cervical spine musculatur; pecs; upper trap; leveator; teres; biceps tendon Rt > Lt                    [facility] Adult PT Treatment/Exercise - 03/05/18 0001      Self-Care   Self-Care  --   education re exercise/overdoing exercise/listening to body      Neuro Re-ed    Neuro Re-ed Details   working on posture and alignment in standing       Moist Heat Therapy   Number Minutes Moist Heat  20 Minutes    Moist Heat Location  Cervical;Shoulder   thoracic spine  Acupuncturist Location  bilat cervical and thoracic spine areas    Electrical Stimulation Action  IFC    Electrical Stimulation Parameters  to tolerance    Electrical Stimulation Goals  Pain;Tone      Manual Therapy   Manual therapy comments  pt prone and supine     Joint Mobilization  thoracic CPA mobs pt prone; cervical CPA mobs pt supine     Soft tissue mobilization  deep tissue work through the bilat thoracic spine/periscapular musculature/teres/paraspinals as well as posterior lateral cervical spine into the Rt shoulder girdle esp pecs and anterior shoulder     Myofascial Release  cervical traction through treatment 20-30 sec x 3 reps     Passive ROM  cervical stretch into flexion; flexion with slight rotation; scaleni stretches x1 each direction 20-30 sec hold                   PT Long Term Goals - 02/15/18 1635      PT LONG TERM GOAL #1   Title  Improve posture and alignment with patient to demonstrated improve upright posture with posterior shoulder girdle engaged 03/29/18    Time  6    Period  Weeks    Status  New       PT LONG TERM GOAL #2   Title  Decrease pain with patient reporting ability to reach overhead and behind back without pain 03/29/18    Time  6    Period  Weeks    Status  New      PT LONG TERM GOAL #3   Title  Increase AROM Rt shoulder to equal or greater than AROM Lt shoulder 03/29/18    Time  6    Period  Weeks    Status  New      PT LONG TERM GOAL #4   Title  Independent in HEP 03/29/18    Time  6    Period  Weeks    Status  New      PT LONG TERM GOAL #5   Title  Improve FOTO to </= 22% limitation 03/29/18    Time  6    Period  Weeks    Status  New            Plan - 03/05/18 1646    Clinical Impression Statement  Patient reports flare up os symptoms in Rt anterior shoulder and nerve type pain in the Lt UE. Suspect that pt was too aggressive with exercises. Explained at length the importance of listening to her body and modifying stretching based on pulling - avoiding pain with stretching. Patient will hold on all stretches for the next 3-5 days until symptoms resolve then return to stretching program gradually. Patient responded well to manual work with thoracic mobs and deep tissue work through thoracic and cervical musculature. Will assess response and consider DN to areas of tightness. She demonstrates good gains in Rt shoudler ROM and improved scapular dynamics with elevation of shoulders. Chronic nature of problems and patients tendency to overdo exercise contributes to increase in symptoms.     Rehab Potential  Good    PT Frequency  2x / week    PT Duration  6 weeks    PT Treatment/Interventions  Patient/family education;ADLs/Self Care Home Management;Cryotherapy;Electrical Stimulation;Iontophoresis 4mg /ml Dexamethasone;Moist Heat;Ultrasound;Dry needling;Manual techniques;Neuromuscular re-education;Therapeutic activities;Therapeutic exercise    PT Next Visit Plan  review HEP; add manual work; progress with stretching as patient tolerates;  posterior shoulder girdle  strengthening; neuromuscular re-education/posterior shoulder girdle facilitation; modalities as indicated     Consulted and Agree with Plan of Care  Patient       Patient will benefit from skilled therapeutic intervention in order to improve the following deficits and impairments:  Postural dysfunction, Improper body mechanics, Pain, Increased fascial restricitons, Increased muscle spasms, Hypomobility, Decreased mobility, Decreased range of motion, Decreased strength, Decreased activity tolerance  Visit Diagnosis: Acute pain of right shoulder  Other symptoms and signs involving the musculoskeletal system  Abnormal posture     Problem List Patient Active Problem List   Diagnosis Date Noted  . Left foot pain 05/28/2017  . Pain in finger of both hands 05/28/2017  . Cervical intraepithelial neoplasia grade 1 12/14/2016  . Malignant melanoma ([place]) 12/14/2016  . Right foot pain 12/14/2016  . Low back pain 05/20/2013    Charlet Harr Nilda Simmer PT, MPH  03/05/2018, 4:53 PM  [facility] [place], [place], [address] Phone: [phone]   Fax:  [phone]  Name: Beckett Hickmon MRN: [id number] Date of Birth: 1985-10-25

## 2018-03-08 ENCOUNTER — Encounter: Payer: Self-pay | Admitting: Rehabilitative and Restorative Service Providers"

## 2018-03-08 ENCOUNTER — Ambulatory Visit (INDEPENDENT_AMBULATORY_CARE_PROVIDER_SITE_OTHER): Payer: 59 | Admitting: Rehabilitative and Restorative Service Providers"

## 2018-03-08 DIAGNOSIS — M25511 Pain in right shoulder: Secondary | ICD-10-CM | POA: Diagnosis not present

## 2018-03-08 DIAGNOSIS — R293 Abnormal posture: Secondary | ICD-10-CM | POA: Diagnosis not present

## 2018-03-08 DIAGNOSIS — R29898 Other symptoms and signs involving the musculoskeletal system: Secondary | ICD-10-CM

## 2018-03-08 NOTE — Therapy (Signed)
[place], [place], [address] Phone: [phone]   Fax:  [phone]  Physical Therapy Treatment  Patient Details  Name: Ashley Whitaker MRN: [id number] Date of Birth: 10-Jul-1985 Referring Provider (PT): Dr Karlton Lemon    Encounter Date: 03/08/2018  PT End of Session - 03/08/18 1709    Visit Number  5    Number of Visits  12    Date for PT Re-Evaluation  03/29/18    PT Start Time  7001    PT Stop Time  1547    PT Time Calculation (min)  30 min    Activity Tolerance  Patient tolerated treatment well       History reviewed. No pertinent past medical history.  Past Surgical History:  Procedure Laterality Date  . APPENDECTOMY      There were no vitals filed for this visit.  Subjective Assessment - 03/08/18 1647    Subjective  Good response to backing off with stretching and exercises. All the Lt UE tingling and Rt shoulder "popping" have resolved. She felt "great" after last treatment - feels it is much easier to correct posture and pull shoulders down and back. Pleased with progress. Not sure that she has any areas that are tightn enough to need dry needling.     Currently in Pain?  No/denies                       [facility] Adult PT Treatment/Exercise - 03/08/18 0001      Shoulder Exercises: Standing   Other Standing Exercises  axial extension 10 sec x 5; scap retraction 10 x 10; L's x 10; W's x10; added reach for the floor for scapular depression     Other Standing Exercises  reverse wall press 5 sec x 10- pain with press up       Modalities   Modalities  --   declined modalites due to her schedule      Manual Therapy   Manual therapy comments  pt prone and supine     Joint Mobilization  thoracic CPA mobs pt prone; cervical CPA mobs pt supine     Soft tissue mobilization  deep tissue work through the bilat thoracic spine/periscapular musculature/teres/paraspinals as well  as posterior lateral cervical spine into the Rt shoulder girdle esp pecs and anterior shoulder     Myofascial Release  cervical traction through treatment 20-30 sec x 3 reps     Passive ROM  cervical stretch into flexion; flexion with slight rotation; scaleni stretches x1 each direction 20-30 sec hold                   PT Long Term Goals - 02/15/18 1635      PT LONG TERM GOAL #1   Title  Improve posture and alignment with patient to demonstrated improve upright posture with posterior shoulder girdle engaged 03/29/18    Time  6    Period  Weeks    Status  New      PT LONG TERM GOAL #2   Title  Decrease pain with patient reporting ability to reach overhead and behind back without pain 03/29/18    Time  6    Period  Weeks    Status  New      PT LONG TERM GOAL #3   Title  Increase AROM Rt shoulder to equal or greater than AROM Lt shoulder 03/29/18    Time  6    Period  Weeks    Status  New      PT LONG TERM GOAL #4   Title  Independent in HEP 03/29/18    Time  6    Period  Weeks    Status  New      PT LONG TERM GOAL #5   Title  Improve FOTO to </= 22% limitation 03/29/18    Time  6    Period  Weeks    Status  New            Plan - 03/08/18 1709    Clinical Impression Statement  Symptoms resolved with manual work and rest form stretching. Patient reports full resolution of UE radicular symptoms after stopping the stretching exercises following last viist. Patient reports excellent response to manual work follwoed by modalities. Continued with manual work today with improved tissue mobility noted with manual work and passive stretching. Patient will avoid over stretching as she restarts exercises in the next couple of days. She will continue PT x 1 additional visit then hold PT for 3-4 weeks while she continues with her HEP - she will call with questions or to reschedule.     Rehab Potential  Good    PT Frequency  2x / week    PT Duration  6 weeks    PT  Treatment/Interventions  Patient/family education;ADLs/Self Care Home Management;Cryotherapy;Electrical Stimulation;Iontophoresis 4mg /ml Dexamethasone;Moist Heat;Ultrasound;Dry needling;Manual techniques;Neuromuscular re-education;Therapeutic activities;Therapeutic exercise    PT Next Visit Plan  review HEP; continue manual work; progress with stretching as patient tolerates; posterior shoulder girdle strengthening; neuromuscular re-education/posterior shoulder girdle facilitation; modalities as indicated     Consulted and Agree with Plan of Care  Patient       Patient will benefit from skilled therapeutic intervention in order to improve the following deficits and impairments:  Postural dysfunction, Improper body mechanics, Pain, Increased fascial restricitons, Increased muscle spasms, Hypomobility, Decreased mobility, Decreased range of motion, Decreased strength, Decreased activity tolerance  Visit Diagnosis: No diagnosis found.     Problem List Patient Active Problem List   Diagnosis Date Noted  . Left foot pain 05/28/2017  . Pain in finger of both hands 05/28/2017  . Cervical intraepithelial neoplasia grade 1 12/14/2016  . Malignant melanoma ([place]) 12/14/2016  . Right foot pain 12/14/2016  . Low back pain 05/20/2013    Ashley Whitaker Nilda Simmer PT, MPH  03/08/2018, 6:20 PM  [facility] [place], [place], [address] Phone: [phone]   Fax:  [phone]  Name: Ashley Whitaker MRN: [id number] Date of Birth: 1985/06/16

## 2018-03-13 ENCOUNTER — Encounter: Payer: Self-pay | Admitting: Physical Therapy

## 2018-03-13 ENCOUNTER — Ambulatory Visit (INDEPENDENT_AMBULATORY_CARE_PROVIDER_SITE_OTHER): Payer: 59 | Admitting: Physical Therapy

## 2018-03-13 DIAGNOSIS — R29898 Other symptoms and signs involving the musculoskeletal system: Secondary | ICD-10-CM | POA: Diagnosis not present

## 2018-03-13 DIAGNOSIS — R293 Abnormal posture: Secondary | ICD-10-CM

## 2018-03-13 DIAGNOSIS — M25511 Pain in right shoulder: Secondary | ICD-10-CM

## 2018-03-13 NOTE — Patient Instructions (Signed)
  [employer] (~$25-30 on [employer]); can get similar item at [employer] in exercise section    

## 2018-03-13 NOTE — Therapy (Addendum)
[place], [place], [address] Phone: [phone]   Fax:  [phone]  Physical Therapy Treatment  Patient Details  Name: Ashley Whitaker MRN: [id number] Date of Birth: 01-Apr-1985 Referring Provider (PT): Dr Karlton Lemon    Encounter Date: 03/13/2018  PT End of Session - 03/13/18 1014    Visit Number  6    Number of Visits  12    Date for PT Re-Evaluation  03/29/18    PT Start Time  0930    PT Stop Time  1010    PT Time Calculation (min)  40 min    Activity Tolerance  Patient tolerated treatment well       History reviewed. No pertinent past medical history.  Past Surgical History:  Procedure Laterality Date  . APPENDECTOMY      There were no vitals filed for this visit.  Subjective Assessment - 03/13/18 0931    Subjective  doing well, pain is greatly improved.  has been doing some yardwork so pain up to 1/10.    Patient Stated Goals  get rid of pain and prevent furture injury     Currently in Pain?  No/denies         [facility] PT Assessment - 03/13/18 0932      Assessment   Medical Diagnosis  Rt shoulder dysfunction     Referring Provider (PT)  Dr Karlton Lemon     Onset Date/Surgical Date  01/05/18    Hand Dominance  Right    Next MD Visit  1/20      Observation/Other Assessments   Focus on Therapeutic Outcomes (FOTO)   69 (31% limited)      AROM   Right Shoulder Extension  57 Degrees    Right Shoulder Flexion  147 Degrees    Right Shoulder ABduction  149 Degrees    Right Shoulder Internal Rotation  30 Degrees    Right Shoulder External Rotation  90 Degrees    Left Shoulder Extension  55 Degrees    Left Shoulder Flexion  155 Degrees    Left Shoulder ABduction  157 Degrees    Left Shoulder Internal Rotation  54 Degrees    Left Shoulder External Rotation  95 Degrees                   [facility] Adult PT Treatment/Exercise - 03/13/18 0933      Exercises   Exercises  Neck      Neck Exercises: Prone   W Back  10 reps      Shoulder Exercises: Supine   Other Supine Exercises  scap squeeze with cervical retractioin 5 sec x 10       Shoulder Exercises: Standing   Other Standing Exercises  axial extension 10 sec x 5; scap retraction 10 x 10; L's x 10; W's x10; added reach for the floor for scapular depression       Manual Therapy   Manual therapy comments  pt prone and supine     Joint Mobilization  thoracic CPA mobs pt prone; cervical CPA mobs pt supine     Soft tissue mobilization  deep tissue work through the bilat thoracic spine/periscapular musculature/teres/paraspinals as well as posterior lateral cervical spine into the Rt shoulder girdle esp pecs and anterior shoulder     Myofascial Release  cervical traction through treatment 20-30 sec x 3 reps     Passive ROM  cervical stretch into flexion; flexion with slight  rotation; scaleni stretches x1 each direction 20-30 sec hold              PT Education - 03/13/18 1017    Education Details  gradual return to exercise, theracane    Person(s) Educated  Patient    Methods  Explanation;Demonstration;Handout    Comprehension  Verbalized understanding;Returned demonstration          PT Long Term Goals - 03/13/18 1014      PT LONG TERM GOAL #1   Title  Improve posture and alignment with patient to demonstrated improve upright posture with posterior shoulder girdle engaged 03/29/18    Time  6    Period  Weeks    Status  Achieved      PT LONG TERM GOAL #2   Title  Decrease pain with patient reporting ability to reach overhead and behind back without pain 03/29/18    Time  6    Period  Weeks    Status  Achieved      PT LONG TERM GOAL #3   Title  Increase AROM Rt shoulder to equal or greater than AROM Lt shoulder 03/29/18    Time  6    Period  Weeks    Status  Partially Met      PT LONG TERM GOAL #4   Title  Independent in HEP 03/29/18    Time  6    Period  Weeks    Status   Achieved      PT LONG TERM GOAL #5   Title  Improve FOTO to </= 22% limitation 03/29/18    Baseline  12/31: 31% limitation    Time  6    Period  Weeks    Status  On-going            Plan - 03/13/18 1015    Clinical Impression Statement  Pt has met 3 LTGs and partially met 1 LTG.  FOTO goal not met at this point, with 3% improvement noted in FOTO score.  Pt requesting hold from PT due to finances and insurance deductible starting over.  Will hold PT and pt to call if needed.    Rehab Potential  Good    PT Frequency  2x / week    PT Duration  6 weeks    PT Treatment/Interventions  Patient/family education;ADLs/Self Care Home Management;Cryotherapy;Electrical Stimulation;Iontophoresis 66m/ml Dexamethasone;Moist Heat;Ultrasound;Dry needling;Manual techniques;Neuromuscular re-education;Therapeutic activities;Therapeutic exercise    PT Next Visit Plan  hold x 30 days; reassess if pt returns, otherwise DC    Consulted and Agree with Plan of Care  Patient       Patient will benefit from skilled therapeutic intervention in order to improve the following deficits and impairments:  Postural dysfunction, Improper body mechanics, Pain, Increased fascial restricitons, Increased muscle spasms, Hypomobility, Decreased mobility, Decreased range of motion, Decreased strength, Decreased activity tolerance  Visit Diagnosis: Acute pain of right shoulder  Other symptoms and signs involving the musculoskeletal system  Abnormal posture     Problem List Patient Active Problem List   Diagnosis Date Noted  . Left foot pain 05/28/2017  . Pain in finger of both hands 05/28/2017  . Cervical intraepithelial neoplasia grade 1 12/14/2016  . Malignant melanoma ([place] 12/14/2016  . Right foot pain 12/14/2016  . Low back pain 05/20/2013      SLaureen Abrahams PT, DPT 03/13/18 10:18 AM    [facility] [address]  [address]  Phone: [phone]   Fax:  [phone]  Name: Ashley Whitaker MRN: [id number] Date of Birth: 1985-07-09  PHYSICAL THERAPY DISCHARGE SUMMARY  Visits from Start of Care: 6  Current functional level related to goals / functional outcomes: See progress note for discharge status    Remaining deficits: Needs to continue with HEP    Education / Equipment: HEP  Plan: Patient agrees to discharge.  Patient goals were met. Patient is being discharged due to being pleased with the current functional level.  ?????    Celyn P. Helene Kelp PT, MPH 04/19/18 3:17 PM

## 2018-09-24 ENCOUNTER — Ambulatory Visit: Payer: Self-pay

## 2018-09-24 ENCOUNTER — Other Ambulatory Visit: Payer: Self-pay

## 2018-09-24 ENCOUNTER — Encounter: Payer: Self-pay | Admitting: Family Medicine

## 2018-09-24 ENCOUNTER — Ambulatory Visit (INDEPENDENT_AMBULATORY_CARE_PROVIDER_SITE_OTHER): Payer: Managed Care, Other (non HMO) | Admitting: Family Medicine

## 2018-09-24 VITALS — BP 106/66 | Ht 70.0 in | Wt 145.0 lb

## 2018-09-24 DIAGNOSIS — M7541 Impingement syndrome of right shoulder: Secondary | ICD-10-CM | POA: Insufficient documentation

## 2018-09-24 DIAGNOSIS — M25511 Pain in right shoulder: Secondary | ICD-10-CM

## 2018-09-24 NOTE — Assessment & Plan Note (Addendum)
Pain likely 2/2 to impingement syndrome with subacromial bursitis. Ultrasound notable for fluid at bursa, otherwise WNL. Recommend rest with activity as tolerated, home exercises, ice PRN, and OTC anti-inflammatories such as aleve/ibuprofen and tylenol as needed for pain/inflammation. Can consider physical therapy, subacromial steroid injection, and/or nitro patches if no improvement. RTC in 1 month, sooner if no improvement or worsening.
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
## 2018-09-24 NOTE — Patient Instructions (Signed)
You have rotator cuff impingement with subacromial bursitis. Try to avoid painful activities (overhead activities, lifting with extended arm) as much as possible. Aleve 2 tabs twice a day with food OR ibuprofen 3 tabs three times a day with food for pain and inflammation. Can take tylenol in addition to this. Icing 15 minutes at a time 3-4 times a day as needed. Subacromial injection may be beneficial to help with pain and to decrease inflammation - I wouldn't recommend this right now though Consider physical therapy with transition to home exercise program. Do home exercise program with theraband and scapular stabilization exercises daily 3 sets of 10 once a day. If not improving at follow-up we will consider further imaging, injection, physical therapy, and/or nitro patches. Follow up with me in 1 month for reevaluation.

## 2018-09-25 ENCOUNTER — Encounter: Payer: Self-pay | Admitting: Family Medicine

## 2019-06-20 ENCOUNTER — Ambulatory Visit (INDEPENDENT_AMBULATORY_CARE_PROVIDER_SITE_OTHER): Payer: 59 | Admitting: Psychology

## 2019-06-20 DIAGNOSIS — F4323 Adjustment disorder with mixed anxiety and depressed mood: Secondary | ICD-10-CM

## 2019-06-25 ENCOUNTER — Ambulatory Visit: Payer: 59 | Admitting: Psychology

## 2019-07-04 ENCOUNTER — Ambulatory Visit: Payer: 59 | Admitting: Psychology

## 2019-07-24 ENCOUNTER — Ambulatory Visit (INDEPENDENT_AMBULATORY_CARE_PROVIDER_SITE_OTHER): Payer: 59 | Admitting: Psychology

## 2019-07-24 DIAGNOSIS — F4323 Adjustment disorder with mixed anxiety and depressed mood: Secondary | ICD-10-CM | POA: Diagnosis not present

## 2019-09-05 ENCOUNTER — Ambulatory Visit (INDEPENDENT_AMBULATORY_CARE_PROVIDER_SITE_OTHER): Payer: 59 | Admitting: Psychology

## 2019-09-05 DIAGNOSIS — F4323 Adjustment disorder with mixed anxiety and depressed mood: Secondary | ICD-10-CM | POA: Diagnosis not present

## 2019-10-25 ENCOUNTER — Ambulatory Visit (INDEPENDENT_AMBULATORY_CARE_PROVIDER_SITE_OTHER): Payer: 59 | Admitting: Psychology

## 2019-10-25 DIAGNOSIS — F4323 Adjustment disorder with mixed anxiety and depressed mood: Secondary | ICD-10-CM

## 2019-11-22 ENCOUNTER — Emergency Department (INDEPENDENT_AMBULATORY_CARE_PROVIDER_SITE_OTHER): Payer: Managed Care, Other (non HMO)

## 2019-11-22 ENCOUNTER — Emergency Department (INDEPENDENT_AMBULATORY_CARE_PROVIDER_SITE_OTHER)
Admission: EM | Admit: 2019-11-22 | Discharge: 2019-11-22 | Disposition: A | Discharge status: Home / Self Care | Payer: Managed Care, Other (non HMO) | Source: Home / Self Care

## 2019-11-22 ENCOUNTER — Encounter: Payer: Self-pay | Admitting: Emergency Medicine

## 2019-11-22 ENCOUNTER — Other Ambulatory Visit: Payer: Self-pay

## 2019-11-22 DIAGNOSIS — W010XXA Fall on same level from slipping, tripping and stumbling without subsequent striking against object, initial encounter: Secondary | ICD-10-CM

## 2019-11-22 DIAGNOSIS — S63501A Unspecified sprain of right wrist, initial encounter: Secondary | ICD-10-CM | POA: Diagnosis not present

## 2019-11-22 NOTE — ED Provider Notes (Signed)
[name]    CSN: [id number] Arrival date & time: 11/22/19  1922      History   Chief Complaint Chief Complaint  Patient presents with  . Wrist Injury    right  . Wrist Pain    HPI Ashley Whitaker is a 34 y.o. female.   HPI Ashley Whitaker is a 34 y.o. female presenting to UC with c/o sudden onset Right wrist pain that started this evening after she fell while crawling in the yard with her children.  She felt her wrist twist and pop.  Limited ROM, pain with any pressure applied to dorsal aspect of her wrist.  Pain is 6/10, shooting and throbbing.  She applied ice PTA. She has seen Dr. Barbaraann Barthel, Sports Medicine in the past for other orthopedic injuries.    History reviewed. No pertinent past medical history.  Patient Active Problem List   Diagnosis Date Noted  . Rotator cuff impingement syndrome of right shoulder 09/24/2018  . Left foot pain 05/28/2017  . Pain in finger of both hands 05/28/2017  . Cervical intraepithelial neoplasia grade 1 12/14/2016  . Malignant melanoma ([place]) 12/14/2016  . Right foot pain 12/14/2016  . Low back pain 05/20/2013    Past Surgical History:  Procedure Laterality Date  . ABLATION    . APPENDECTOMY      OB History   No obstetric history on file.      Home Medications    Prior to Admission medications   Medication Sig Start Date End Date Taking? Authorizing Provider  Cyanocobalamin (B-12 COMPLIANCE INJECTION IJ) Inject as directed.   Yes [provider]  Multiple Vitamin (MULTIVITAMIN) tablet Take by mouth.   Yes [provider]  fluticasone (FLONASE) 50 MCG/ACT nasal spray fluticasone propionate 50 mcg/actuation nasal spray,suspension    [provider]  levonorgestrel (MIRENA, 52 MG,) 20 MCG/24HR IUD Mirena 20 mcg/24 hours (5 yrs) 52 mg intrauterine device  Take 1 device by intrauterine route.    [provider]  metroNIDAZOLE (FLAGYL) 500 MG tablet metronidazole 500 mg tablet   Take 1 tablet every 8 hours by oral route for 7 days.    [provider]    Family History Family History  Problem Relation Age of Onset  . Hyperlipidemia Father   . Hypertension Father   . Hypertension Mother   . Diabetes Neg Hx   . Heart attack Neg Hx   . Sudden death Neg Hx     Social History Social History   Tobacco Use  . Smoking status: Never Smoker  . Smokeless tobacco: Never Used  Vaping Use  . Vaping Use: Never used  Substance Use Topics  . Alcohol use: Yes    Alcohol/week: 3.0 standard drinks    Types: 3 Standard drinks or equivalent per week  . Drug use: Never     Allergies   Chlorophyll, Citric acid, Lidocaine, and Sulfa antibiotics   Review of Systems Review of Systems  Musculoskeletal: Positive for arthralgias and joint swelling.  Skin: Negative for color change and wound.  Neurological: Positive for weakness. Negative for numbness.     Physical Exam Triage Vital Signs ED Triage Vitals  Enc Vitals Group     BP 11/22/19 1934 122/71     Pulse Rate 11/22/19 1934 80     Resp 11/22/19 1934 15     Temp 11/22/19 1934 98.8 F (37.1 C)     Temp Source 11/22/19 1934 Oral     SpO2 11/22/19 1934  98 %     Weight --      Height --      Head Circumference --      Peak Flow --      Pain Score 11/22/19 1935 6     Pain Loc --      Pain Edu? --      Excl. in [place]? --    No data found.  Updated Vital Signs BP 122/71 (BP Location: Left Arm)   Pulse 80   Temp 98.8 F (37.1 C) (Oral)   Resp 15   SpO2 98%   Visual Acuity Right Eye Distance:   Left Eye Distance:   Bilateral Distance:    Right Eye Near:   Left Eye Near:    Bilateral Near:     Physical Exam Vitals and nursing note reviewed.  Constitutional:      Appearance: Normal appearance. She is well-developed.  HENT:     Head: Normocephalic and atraumatic.  Cardiovascular:     Rate and Rhythm: Normal rate and regular rhythm.     Pulses:          Radial pulses are 2+ on the left  side.  Pulmonary:     Effort: Pulmonary effort is normal.  Musculoskeletal:        General: Swelling and tenderness present.     Cervical back: Normal range of motion.     Comments: Right wrist: mild edema, tenderness to dorsal aspect between distal radius and ulna. Slight creased flexion and extension. No snuff box tenderness. 5/5 grip strength. No tenderness to elbow, full ROM.  Skin:    General: Skin is warm and dry.     Capillary Refill: Capillary refill takes less than 2 seconds.     Findings: No bruising or erythema.  Neurological:     Mental Status: She is alert and oriented to person, place, and time.     Sensory: No sensory deficit.  Psychiatric:        Behavior: Behavior normal.      UC Treatments / Results  Labs (all labs ordered are listed, but only abnormal results are displayed) Labs Reviewed - No data to display  EKG   Radiology DG Wrist Complete Right  Result Date: 11/22/2019 CLINICAL DATA:  Right wrist injury tonight. Immediate pain and swelling. Heard a pop. EXAM: RIGHT WRIST - COMPLETE 3+ VIEW COMPARISON:  None. FINDINGS: There is no evidence of fracture or dislocation. There is no evidence of arthropathy or other focal bone abnormality. Soft tissues are unremarkable. IMPRESSION: Negative. Electronically Signed   By: Lucienne Capers M.D.   On: 11/22/2019 20:06    Procedures Procedures (including critical care time)  Medications Ordered in UC Medications - No data to display  Initial Impression / Assessment and Plan / UC Course  I have reviewed the triage vital signs and the nursing notes.  Pertinent labs & imaging results that were available during my care of the patient were reviewed by me and considered in my medical decision making (see chart for details).     Discussed imaging with pt Will tx as sprain Wrist splint provided Encouraged to f/u with Dr. Barbaraann Barthel, Sports Medicine next week for further evaluation and treatment, additional imaging  may be ordered AVS given  Final Clinical Impressions(s) / UC Diagnoses   Final diagnoses:  Right wrist sprain, initial encounter     Discharge Instructions      You may take 500mg  acetaminophen every 4-6 hours or in  combination with ibuprofen 400-600mg  every 6-8 hours as needed for pain and inflammation.  Try to wear the wrist splint at all times except for bathing and applying a cool compress 2-3 times daily to help with pain and swelling.  Call Dr. Barbaraann Barthel on Monday to schedule a follow up appointment next week, he may order more detailed imaging to look at the ligaments and tendons in your hand and wrist.      ED Prescriptions    None     PDMP not reviewed this encounter.   Noe Gens, [place] 11/22/19 2053

## 2019-11-22 NOTE — ED Triage Notes (Signed)
Pt was bear crawling in yard w/ son- fell to side and landed on R wrist , twisted it & felt a pop- immediate pain w/ swelling Limited ROM Tylenol refused in triage  Iced pta  Pt had Pfizer vaccine in March/april 2021

## 2019-11-22 NOTE — Discharge Instructions (Signed)
°  You may take 500mg  acetaminophen every 4-6 hours or in combination with ibuprofen 400-600mg  every 6-8 hours as needed for pain and inflammation.  Try to wear the wrist splint at all times except for bathing and applying a cool compress 2-3 times daily to help with pain and swelling.  Call Dr. Barbaraann Barthel on Monday to schedule a follow up appointment next week, he may order more detailed imaging to look at the ligaments and tendons in your hand and wrist.

## 2019-11-25 ENCOUNTER — Ambulatory Visit: Payer: Self-pay

## 2019-11-25 ENCOUNTER — Other Ambulatory Visit: Payer: Self-pay

## 2019-11-25 ENCOUNTER — Ambulatory Visit (INDEPENDENT_AMBULATORY_CARE_PROVIDER_SITE_OTHER): Payer: Managed Care, Other (non HMO) | Admitting: Family Medicine

## 2019-11-25 VITALS — BP 100/68 | Ht 70.0 in | Wt 145.0 lb

## 2019-11-25 DIAGNOSIS — M25531 Pain in right wrist: Secondary | ICD-10-CM | POA: Diagnosis not present

## 2019-11-25 NOTE — Patient Instructions (Signed)
You sprained your wrist. Wear the brace except when icing, bathing.  Consider wearing with sleep if it's too painful without. Icing 15 minutes at a time 3-4 times a day. Aleve 1-2 tabs twice a day as needed. Follow up with me in 7-10 days for reevaluation.

## 2019-11-26 ENCOUNTER — Encounter: Payer: Self-pay | Admitting: Family Medicine

## 2019-11-26 NOTE — Progress Notes (Signed)
PCP: Patient, No Pcp Per  Subjective:   HPI: Patient is a 34 y.o. female here for right wrist pain.  Patient reports she's had pain in dorsal aspect of right wrist since about April. This was tolerable but then 2 days ago while playing with her son she accidentally hyperflexed her right wrist severely exacerbating the pain. Pain level up to 9/10 and sharp dorsal wrist. Has been icing, wearing wrist brace. Not taking medications for this. She is right handed.  History reviewed. No pertinent past medical history.  Current Outpatient Medications on File Prior to Visit  Medication Sig Dispense Refill  . Multiple Vitamin (MULTIVITAMIN) tablet Take by mouth.    . Cyanocobalamin (B-12 COMPLIANCE INJECTION IJ) Inject as directed. (Patient not taking: Reported on 11/25/2019)    . fluticasone (FLONASE) 50 MCG/ACT nasal spray fluticasone propionate 50 mcg/actuation nasal spray,suspension (Patient not taking: Reported on 11/25/2019)    . levonorgestrel (MIRENA, 52 MG,) 20 MCG/24HR IUD Mirena 20 mcg/24 hours (5 yrs) 52 mg intrauterine device  Take 1 device by intrauterine route.    . metroNIDAZOLE (FLAGYL) 500 MG tablet metronidazole 500 mg tablet  Take 1 tablet every 8 hours by oral route for 7 days. (Patient not taking: Reported on 11/25/2019)     No current facility-administered medications on file prior to visit.    Past Surgical History:  Procedure Laterality Date  . ABLATION    . APPENDECTOMY      Allergies  Allergen Reactions  . Chlorophyll Hives, Nausea And Vomiting and Swelling  . Citric Acid Hives, Rash and Swelling  . Lidocaine   . Sulfa Antibiotics     Social History   Socioeconomic History  . Marital status: Married    Spouse name: Not on file  . Number of children: Not on file  . Years of education: Not on file  . Highest education level: Not on file  Occupational History  . Not on file  Tobacco Use  . Smoking status: Never Smoker  . Smokeless tobacco: Never Used   Vaping Use  . Vaping Use: Never used  Substance and Sexual Activity  . Alcohol use: Yes    Alcohol/week: 3.0 standard drinks    Types: 3 Standard drinks or equivalent per week  . Drug use: Never  . Sexual activity: Not on file  Other Topics Concern  . Not on file  Social History Narrative  . Not on file   Social Determinants of Health   Financial Resource Strain:   . Difficulty of Paying Living Expenses: Not on file  Food Insecurity:   . Worried About Charity fundraiser in the Last Year: Not on file  . Ran Out of Food in the Last Year: Not on file  Transportation Needs:   . Lack of Transportation (Medical): Not on file  . Lack of Transportation (Non-Medical): Not on file  Physical Activity:   . Days of Exercise per Week: Not on file  . Minutes of Exercise per Session: Not on file  Stress:   . Feeling of Stress : Not on file  Social Connections:   . Frequency of Communication with Friends and Family: Not on file  . Frequency of Social Gatherings with Friends and Family: Not on file  . Attends Religious Services: Not on file  . Active Member of Clubs or Organizations: Not on file  . Attends Archivist Meetings: Not on file  . Marital Status: Not on file  Intimate Partner Violence:   .  Fear of Current or Ex-Partner: Not on file  . Emotionally Abused: Not on file  . Physically Abused: Not on file  . Sexually Abused: Not on file    Family History  Problem Relation Age of Onset  . Hyperlipidemia Father   . Hypertension Father   . Hypertension Mother   . Diabetes Neg Hx   . Heart attack Neg Hx   . Sudden death Neg Hx     BP 100/68   Ht 5\' 10"  (1.778 m)   Wt 145 lb (65.8 kg)   BMI 20.81 kg/m   [facility] 11/25/2019  Frequency of aerobic exercise (# of days/week) 5  Average time in minutes 45  Frequency of strengthening activities (# of days/week) 5    No flowsheet data found.  Review of Systems: See HPI above.      Objective:  Physical Exam:  Gen: NAD, comfortable in exam room  Right wrist: No deformity, swelling, bruising.Marland Kitchen FROM with 5/5 strength but pain on full extension. TTP dorsal wrist joint.  Minimal tenderness snuffbox.  No distal radius, ulna tenderness. NVI distally.   Limited u/s right wrist:  Mild wrist joint effusion noted.  No cortical irregularity of distal radius or ulna, scaphoid.  Scapholunate ligament appears intact.  Extensor tendons intact. Assessment & Plan:  1. Right wrist injury - 2/2 sprain.  Ultrasound and mechanism reassuring. Wrist brace, icing, aleve.  F/u in 7-10 days for reevaluation.

## 2019-12-04 ENCOUNTER — Ambulatory Visit: Payer: Managed Care, Other (non HMO) | Admitting: Family Medicine

## 2019-12-09 ENCOUNTER — Ambulatory Visit (INDEPENDENT_AMBULATORY_CARE_PROVIDER_SITE_OTHER): Payer: Managed Care, Other (non HMO) | Admitting: Family Medicine

## 2019-12-09 ENCOUNTER — Ambulatory Visit: Payer: 59 | Admitting: Psychology

## 2019-12-09 ENCOUNTER — Other Ambulatory Visit: Payer: Self-pay

## 2019-12-09 ENCOUNTER — Encounter: Payer: Self-pay | Admitting: Family Medicine

## 2019-12-09 VITALS — BP 110/78 | Ht 70.0 in | Wt 145.0 lb

## 2019-12-09 DIAGNOSIS — M25531 Pain in right wrist: Secondary | ICD-10-CM | POA: Diagnosis not present

## 2019-12-09 NOTE — Patient Instructions (Signed)
We will go ahead with an MRI of your wrist to assess for an occult scaphoid fracture, scapholunate dissociation. Continue with wrist brace, icing aleve in meantime. I will call you with results and next steps.

## 2019-12-09 NOTE — Progress Notes (Signed)
PCP: Patient, No Pcp Per  Subjective:   HPI: Patient is a 34 y.o. female here for right wrist pain.  9/13: Patient reports she's had pain in dorsal aspect of right wrist since about April. This was tolerable but then 2 days ago while playing with her son she accidentally hyperflexed her right wrist severely exacerbating the pain. Pain level up to 9/10 and sharp dorsal wrist. Has been icing, wearing wrist brace. Not taking medications for this. She is right handed.  9/27: Unfortunately patient's pain has continued since last visit. Worse if trying to push up from seated position. Wearing wrist brace regularly. Icing and taking aleve every 2-3 days. No new injuries.  History reviewed. No pertinent past medical history.  Current Outpatient Medications on File Prior to Visit  Medication Sig Dispense Refill  . Cyanocobalamin (B-12 COMPLIANCE INJECTION IJ) Inject as directed. (Patient not taking: Reported on 11/25/2019)    . fluticasone (FLONASE) 50 MCG/ACT nasal spray fluticasone propionate 50 mcg/actuation nasal spray,suspension (Patient not taking: Reported on 11/25/2019)    . levonorgestrel (MIRENA, 52 MG,) 20 MCG/24HR IUD Mirena 20 mcg/24 hours (5 yrs) 52 mg intrauterine device  Take 1 device by intrauterine route.    . metroNIDAZOLE (FLAGYL) 500 MG tablet metronidazole 500 mg tablet  Take 1 tablet every 8 hours by oral route for 7 days. (Patient not taking: Reported on 11/25/2019)    . Multiple Vitamin (MULTIVITAMIN) tablet Take by mouth.     No current facility-administered medications on file prior to visit.    Past Surgical History:  Procedure Laterality Date  . ABLATION    . APPENDECTOMY      Allergies  Allergen Reactions  . Chlorophyll Hives, Nausea And Vomiting and Swelling  . Citric Acid Hives, Rash and Swelling  . Lidocaine   . Sulfa Antibiotics     Social History   Socioeconomic History  . Marital status: Married    Spouse name: Not on file  . Number of  children: Not on file  . Years of education: Not on file  . Highest education level: Not on file  Occupational History  . Not on file  Tobacco Use  . Smoking status: Never Smoker  . Smokeless tobacco: Never Used  Vaping Use  . Vaping Use: Never used  Substance and Sexual Activity  . Alcohol use: Yes    Alcohol/week: 3.0 standard drinks    Types: 3 Standard drinks or equivalent per week  . Drug use: Never  . Sexual activity: Not on file  Other Topics Concern  . Not on file  Social History Narrative  . Not on file   Social Determinants of Health   Financial Resource Strain:   . Difficulty of Paying Living Expenses: Not on file  Food Insecurity:   . Worried About Charity fundraiser in the Last Year: Not on file  . Ran Out of Food in the Last Year: Not on file  Transportation Needs:   . Lack of Transportation (Medical): Not on file  . Lack of Transportation (Non-Medical): Not on file  Physical Activity:   . Days of Exercise per Week: Not on file  . Minutes of Exercise per Session: Not on file  Stress:   . Feeling of Stress : Not on file  Social Connections:   . Frequency of Communication with Friends and Family: Not on file  . Frequency of Social Gatherings with Friends and Family: Not on file  . Attends Religious Services: Not on file  .  Active Member of Clubs or Organizations: Not on file  . Attends Archivist Meetings: Not on file  . Marital Status: Not on file  Intimate Partner Violence:   . Fear of Current or Ex-Partner: Not on file  . Emotionally Abused: Not on file  . Physically Abused: Not on file  . Sexually Abused: Not on file    Family History  Problem Relation Age of Onset  . Hyperlipidemia Father   . Hypertension Father   . Hypertension Mother   . Diabetes Neg Hx   . Heart attack Neg Hx   . Sudden death Neg Hx     BP 110/78   Ht 5\' 10"  (1.778 m)   Wt 145 lb (65.8 kg)   BMI 20.81 kg/m   [facility]  11/25/2019 12/09/2019  Frequency of aerobic exercise (# of days/week) 5 5  Average time in minutes 45 45  Frequency of strengthening activities (# of days/week) 5 5    No flowsheet data found.  Review of Systems: See HPI above.     Objective:  Physical Exam:  Gen: NAD, comfortable in exam room  Right wrist: No deformity, swelling, bruising. Limitation of about 5 degrees extension, painful.  Strength 5/5 finger abduction, thumb opposition, finger extension. TTP over scaphoid, dorsal wrist joint.  No other tenderness. NVI distally.  Assessment & Plan:  1. Right wrist injury - radiographs were negative and reassuring but she continues to have pain, tenderness in snuffbox in addition to dorsal wrist joint.  Will go ahead with MRI to assess for occult scaphoid fracture, scapholunate dissociation.  Icing, aleve, brace in meantime.

## 2019-12-14 ENCOUNTER — Ambulatory Visit (INDEPENDENT_AMBULATORY_CARE_PROVIDER_SITE_OTHER): Payer: Managed Care, Other (non HMO)

## 2019-12-14 ENCOUNTER — Other Ambulatory Visit: Payer: Self-pay

## 2019-12-14 DIAGNOSIS — M25531 Pain in right wrist: Secondary | ICD-10-CM

## 2019-12-14 DIAGNOSIS — S6980XD Other specified injuries of unspecified wrist, hand and finger(s), subsequent encounter: Secondary | ICD-10-CM | POA: Diagnosis not present

## 2019-12-18 ENCOUNTER — Other Ambulatory Visit: Payer: Self-pay

## 2019-12-18 DIAGNOSIS — M25531 Pain in right wrist: Secondary | ICD-10-CM

## 2019-12-18 NOTE — Telephone Encounter (Signed)
Ashley Whitaker can you send a referral to therapy at the place noted in her patient message?  It's for wrist pain.  Thanks!

## 2020-02-10 ENCOUNTER — Ambulatory Visit (INDEPENDENT_AMBULATORY_CARE_PROVIDER_SITE_OTHER): Payer: BC Managed Care – PPO | Admitting: Family Medicine

## 2020-02-10 ENCOUNTER — Other Ambulatory Visit: Payer: Self-pay

## 2020-02-10 ENCOUNTER — Encounter: Payer: Self-pay | Admitting: Family Medicine

## 2020-02-10 VITALS — BP 104/76 | Ht 70.0 in | Wt 145.0 lb

## 2020-02-10 DIAGNOSIS — M2550 Pain in unspecified joint: Secondary | ICD-10-CM

## 2020-02-10 DIAGNOSIS — M25531 Pain in right wrist: Secondary | ICD-10-CM | POA: Diagnosis not present

## 2020-02-10 NOTE — Patient Instructions (Signed)
Get bloodwork after you leave today (ANA, RF, CBC with diff, CMP, and CRP) - we will contact you with the results. While you do have this small cyst in your wrist it is on the palm side and isn't getting pinched with the motions you describe when looking at it on ultrasound. If bloodwork is normal we can consider a trial of draining this though if it's bothersome enough.

## 2020-02-10 NOTE — Progress Notes (Signed)
PCP: Patient, No Pcp Per  Subjective:   HPI: Patient is a 34 y.o. female here for right wrist pain.  9/13: Patient reports she's had pain in dorsal aspect of right wrist since about April. This was tolerable but then 2 days ago while playing with her son she accidentally hyperflexed her right wrist severely exacerbating the pain. Pain level up to 9/10 and sharp dorsal wrist. Has been icing, wearing wrist brace. Not taking medications for this. She is right handed.  9/27: Unfortunately patient's pain has continued since last visit. Worse if trying to push up from seated position. Wearing wrist brace regularly. Icing and taking aleve every 2-3 days. No new injuries.  11/29: Despite physical therapy patient's wrist pain continues to bother her. She has pain primarily with pushing up from seated position. Pain is still dorsal in same location. Noticing pain in left wrist now as well. No warmth, redness, swelling that she has noticed. Bothers more after working out but ok while working out.  History reviewed. No pertinent past medical history.  Current Outpatient Medications on File Prior to Visit  Medication Sig Dispense Refill  . Cyanocobalamin (B-12 COMPLIANCE INJECTION IJ) Inject as directed. (Patient not taking: Reported on 11/25/2019)    . fluticasone (FLONASE) 50 MCG/ACT nasal spray fluticasone propionate 50 mcg/actuation nasal spray,suspension (Patient not taking: Reported on 11/25/2019)    . levonorgestrel (MIRENA, 52 MG,) 20 MCG/24HR IUD Mirena 20 mcg/24 hours (5 yrs) 52 mg intrauterine device  Take 1 device by intrauterine route.    . metroNIDAZOLE (FLAGYL) 500 MG tablet metronidazole 500 mg tablet  Take 1 tablet every 8 hours by oral route for 7 days. (Patient not taking: Reported on 11/25/2019)    . Multiple Vitamin (MULTIVITAMIN) tablet Take by mouth.     No current facility-administered medications on file prior to visit.    Past Surgical History:  Procedure  Laterality Date  . ABLATION    . APPENDECTOMY      Allergies  Allergen Reactions  . Chlorophyll Hives, Nausea And Vomiting and Swelling  . Citric Acid Hives, Rash and Swelling  . Lidocaine   . Sulfa Antibiotics     Social History   Socioeconomic History  . Marital status: Married    Spouse name: Not on file  . Number of children: Not on file  . Years of education: Not on file  . Highest education level: Not on file  Occupational History  . Not on file  Tobacco Use  . Smoking status: Never Smoker  . Smokeless tobacco: Never Used  Vaping Use  . Vaping Use: Never used  Substance and Sexual Activity  . Alcohol use: Yes    Alcohol/week: 3.0 standard drinks    Types: 3 Standard drinks or equivalent per week  . Drug use: Never  . Sexual activity: Not on file  Other Topics Concern  . Not on file  Social History Narrative  . Not on file   Social Determinants of Health   Financial Resource Strain:   . Difficulty of Paying Living Expenses: Not on file  Food Insecurity:   . Worried About Charity fundraiser in the Last Year: Not on file  . Ran Out of Food in the Last Year: Not on file  Transportation Needs:   . Lack of Transportation (Medical): Not on file  . Lack of Transportation (Non-Medical): Not on file  Physical Activity:   . Days of Exercise per Week: Not on file  . Minutes of  Exercise per Session: Not on file  Stress:   . Feeling of Stress : Not on file  Social Connections:   . Frequency of Communication with Friends and Family: Not on file  . Frequency of Social Gatherings with Friends and Family: Not on file  . Attends Religious Services: Not on file  . Active Member of Clubs or Organizations: Not on file  . Attends Archivist Meetings: Not on file  . Marital Status: Not on file  Intimate Partner Violence:   . Fear of Current or Ex-Partner: Not on file  . Emotionally Abused: Not on file  . Physically Abused: Not on file  . Sexually Abused: Not  on file    Family History  Problem Relation Age of Onset  . Hyperlipidemia Father   . Hypertension Father   . Hypertension Mother   . Diabetes Neg Hx   . Heart attack Neg Hx   . Sudden death Neg Hx     BP 104/76   Ht 5\' 10"  (1.778 m)   Wt 145 lb (65.8 kg)   BMI 20.81 kg/m   [facility] Adult Exercise 11/25/2019 12/09/2019 02/10/2020  Frequency of aerobic exercise (# of days/week) 5 5 4   Average time in minutes 45 45 45  Frequency of strengthening activities (# of days/week) 5 5 2     No flowsheet data found.  Review of Systems: See HPI above.     Objective:  Physical Exam:  Gen: NAD, comfortable in exam room  Right wrist: No deformity, redness, swelling. FROM with 5/5 strength.  Pain on full extension and flexion.  No pain ulnar and radial deviation. No tenderness to palpation currently. NVI distally.  MSK u/s right wrist:  Small volar cyst visualized again on radial side - no impingement on full flexion and extension on dynamic imaging. Questionable increased fluid wrist joint.  Assessment & Plan:  1. Right wrist injury - Radiographs and MRI were reassuring.  Ultrasound only shows cyst seen on MRI but on opposite side of her pain and on dynamic views no evidence impingement of this cyst.  Not improving with conservative measures including physical therapy.  Noted getting pain on left wrist now also.  Will go ahead with labwork (ANA, RF, CBC with diff, CMP, CRP) as next step.

## 2020-02-11 LAB — COMPREHENSIVE METABOLIC PANEL
ALT: 14 IU/L (ref 0–32)
AST: 25 IU/L (ref 0–40)
Albumin/Globulin Ratio: 2.1 (ref 1.2–2.2)
Albumin: 5 g/dL — ABNORMAL HIGH (ref 3.8–4.8)
Alkaline Phosphatase: 51 IU/L (ref 44–121)
BUN/Creatinine Ratio: 13 (ref 9–23)
BUN: 10 mg/dL (ref 6–20)
Bilirubin Total: 0.5 mg/dL (ref 0.0–1.2)
CO2: 23 mmol/L (ref 20–29)
Calcium: 9.6 mg/dL (ref 8.7–10.2)
Chloride: 103 mmol/L (ref 96–106)
Creatinine, Ser: 0.76 mg/dL (ref 0.57–1.00)
GFR calc Af Amer: 117 mL/min/{1.73_m2} (ref >59)
GFR calc non Af Amer: 101 mL/min/{1.73_m2} (ref >59)
Globulin, Total: 2.4 g/dL (ref 1.5–4.5)
Glucose: 85 mg/dL (ref 65–99)
Potassium: 4.6 mmol/L (ref 3.5–5.2)
Sodium: 140 mmol/L (ref 134–144)
Total Protein: 7.4 g/dL (ref 6.0–8.5)

## 2020-02-11 LAB — RHEUMATOID FACTOR: Rheumatoid fact SerPl-aCnc: <10 IU/mL (ref <14.0)

## 2020-02-11 LAB — CBC WITH DIFFERENTIAL/PLATELET
Basophils Absolute: 0 10*3/uL (ref 0.0–0.2)
Basos: 0 %
EOS (ABSOLUTE): 0.1 10*3/uL (ref 0.0–0.4)
Eos: 1 %
Hematocrit: 38.5 % (ref 34.0–46.6)
Hemoglobin: 13.7 g/dL (ref 11.1–15.9)
Immature Grans (Abs): 0 10*3/uL (ref 0.0–0.1)
Immature Granulocytes: 0 %
Lymphocytes Absolute: 1.1 10*3/uL (ref 0.7–3.1)
Lymphs: 25 %
MCH: 31.7 pg (ref 26.6–33.0)
MCHC: 35.6 g/dL (ref 31.5–35.7)
MCV: 89 fL (ref 79–97)
Monocytes Absolute: 0.4 10*3/uL (ref 0.1–0.9)
Monocytes: 9 %
Neutrophils Absolute: 2.7 10*3/uL (ref 1.4–7.0)
Neutrophils: 65 %
Platelets: 169 10*3/uL (ref 150–450)
RBC: 4.32 x10E6/uL (ref 3.77–5.28)
RDW: 11.2 % — ABNORMAL LOW (ref 11.7–15.4)
WBC: 4.3 10*3/uL (ref 3.4–10.8)

## 2020-02-11 LAB — C-REACTIVE PROTEIN: CRP: <1 mg/L (ref 0–10)

## 2020-02-11 LAB — ANA: Anti Nuclear Antibody (ANA): NEGATIVE

## 2020-02-24 ENCOUNTER — Other Ambulatory Visit: Payer: Self-pay

## 2020-02-24 ENCOUNTER — Ambulatory Visit (INDEPENDENT_AMBULATORY_CARE_PROVIDER_SITE_OTHER): Payer: BC Managed Care – PPO | Admitting: Family Medicine

## 2020-02-24 ENCOUNTER — Encounter: Payer: Self-pay | Admitting: Family Medicine

## 2020-02-24 VITALS — BP 108/72 | Ht 70.0 in | Wt 145.0 lb

## 2020-02-24 DIAGNOSIS — M25531 Pain in right wrist: Secondary | ICD-10-CM | POA: Diagnosis not present

## 2020-02-24 NOTE — Progress Notes (Signed)
Patient returned today for aspiration of ganglion cyst of right wrist.  Performed as noted below.  Advised to contact us in 2-3 weeks to let us know how she's doing.  After informed written consent timeout was performed.  Patient was seated in exam room.  Area overlying volar right wrist prepped with alcohol swabs then utilizing ultrasound guidance, synovial cyst of patient's right wrist was aspirated.  Patient tolerated procedure well without immediate complications.  < 47mL gelatinous fluid aspirated.

## 2020-08-19 ENCOUNTER — Ambulatory Visit: Payer: BC Managed Care – PPO | Admitting: Psychology

## 2020-09-07 ENCOUNTER — Ambulatory Visit: Payer: BC Managed Care – PPO | Admitting: Psychology

## 2020-11-13 IMAGING — MR MR WRIST*R* W/O CM
6 series · 40 of 40 positions shown · non-contrast
Comparison: Plain films right wrist 11/22/2019.

CLINICAL DATA: Posterior and medial right wrist pain since a
hyperextension injury which occurred when the patient was playing
with her children 11/22/2019. The patient heard a pop in her wrist
at the time of the injury. Subsequent encounter.

EXAM:
MR OF THE RIGHT WRIST WITHOUT CONTRAST
TECHNIQUE: Multiplanar, multisequence MR imaging of the right wrist was
performed. No intravenous contrast was administered.

[Series 3: T2 fat-sat · axial · 3.0mm · 0.39mm/px · z∈[-61,+3]mm · 7 of 20 slices shown (1 of 2)]
[im 1/20]
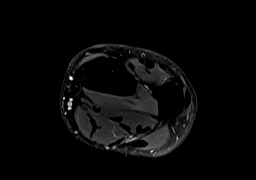
[im 4/20]
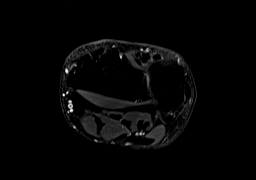
[im 7/20]
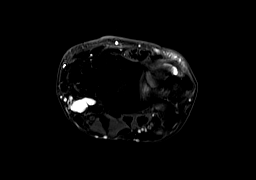
[im 10/20]
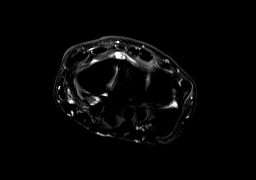
[im 13/20]
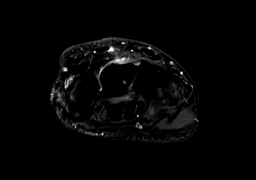
[im 16/20]
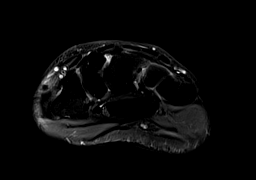
[im 20/20]
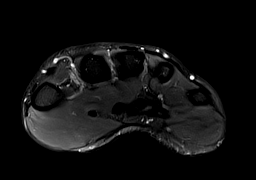

[Series 4: T1 · axial · 3.0mm · 0.39mm/px · z∈[-61,+3]mm · 7 of 20 slices shown (1 of 2)]
[im 1/20]
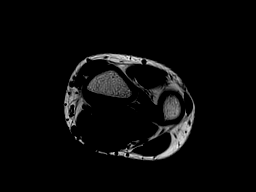
[im 4/20]
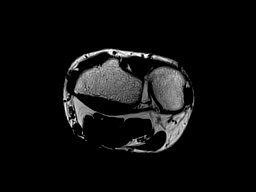
[im 7/20]
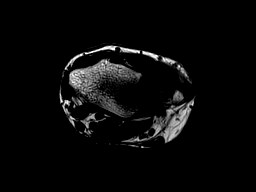
[im 10/20]
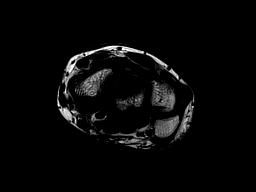
[im 13/20]
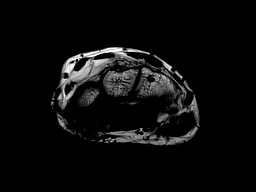
[im 16/20]
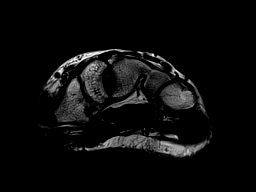
[im 20/20]
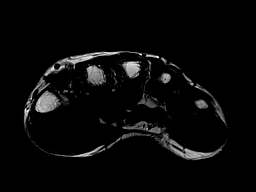

[Series 5: T1 · coronal · 3.0mm · 0.39mm/px · 6 of 15 slices shown (2 of 2)]
[im 1/15]
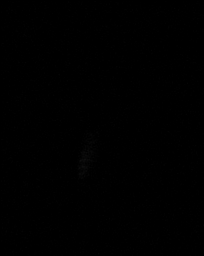
[im 3/15]
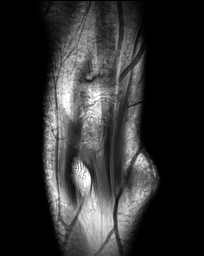
[im 6/15]
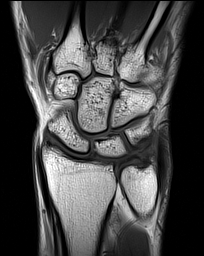
[im 9/15]
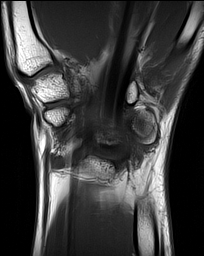
[im 12/15]
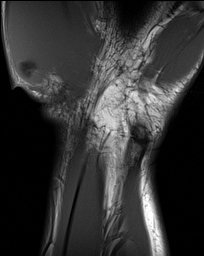
[im 15/15]
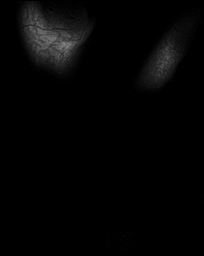

[Series 6: T2 fat-sat · coronal · 3.0mm · 0.39mm/px · 6 of 15 slices shown (2 of 2)]
[im 1/15]
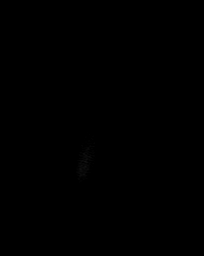
[im 3/15]
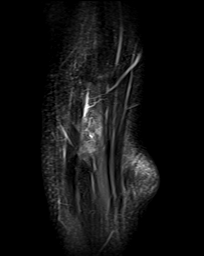
[im 6/15]
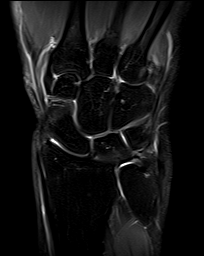
[im 9/15]
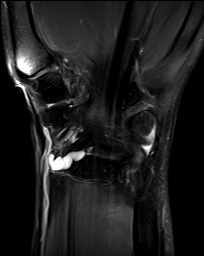
[im 12/15]
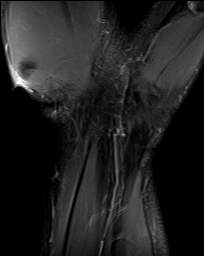
[im 15/15]
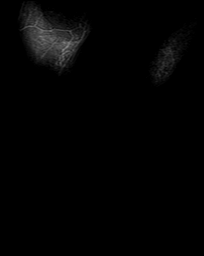

[Series 7: PD fat-sat · coronal · 3.0mm · 0.20mm/px · 6 of 15 slices shown (1 of 2)]
[im 1/15]
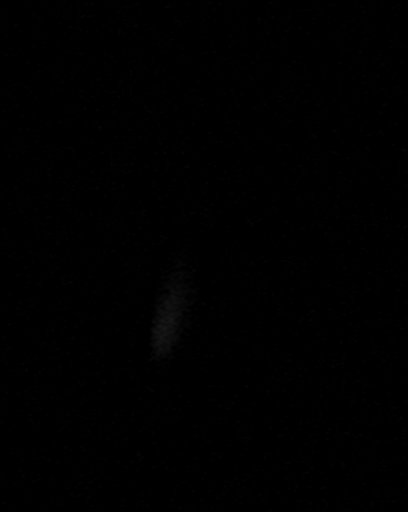
[im 3/15]
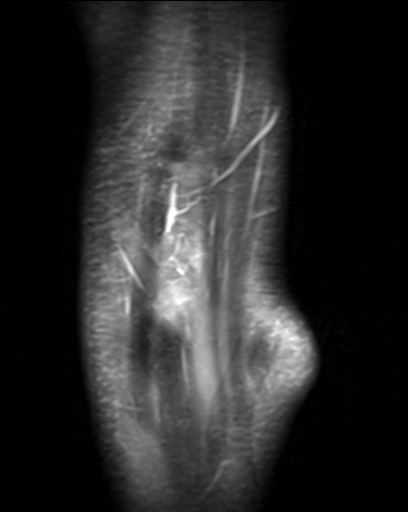
[im 6/15]
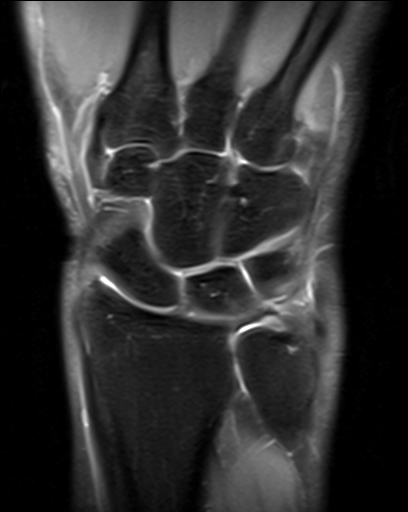
[im 9/15]
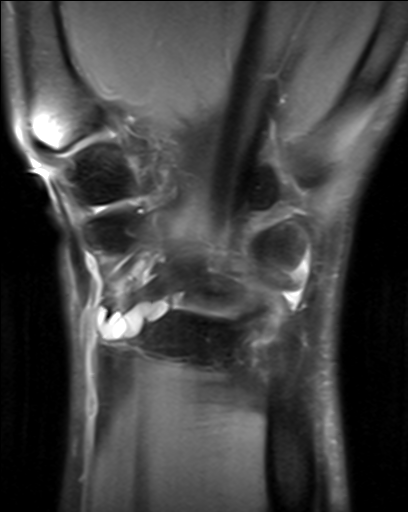
[im 12/15]
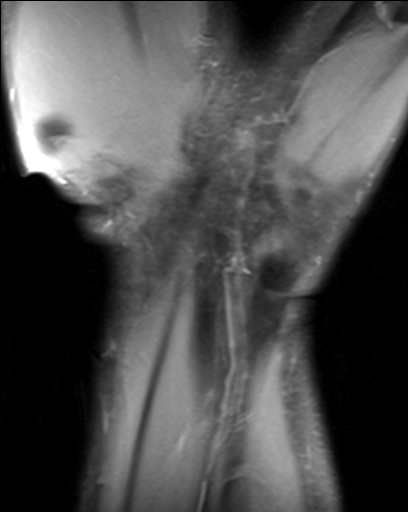
[im 15/15]
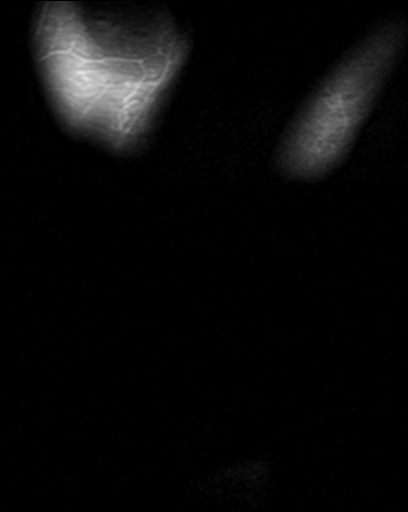

[Series 8: PD fat-sat · sagittal · 3.0mm · 0.20mm/px · 8 of 21 slices shown (2 of 2)]
[im 1/21]
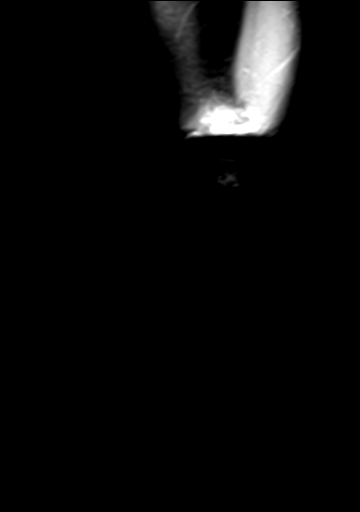
[im 3/21]
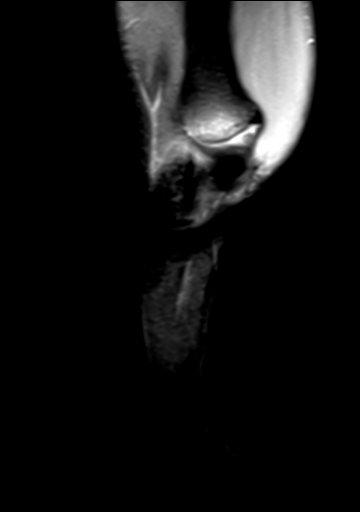
[im 6/21]
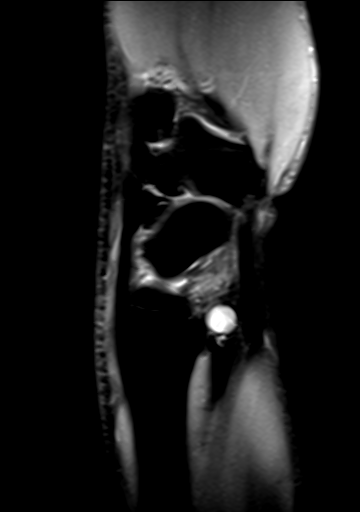
[im 9/21]
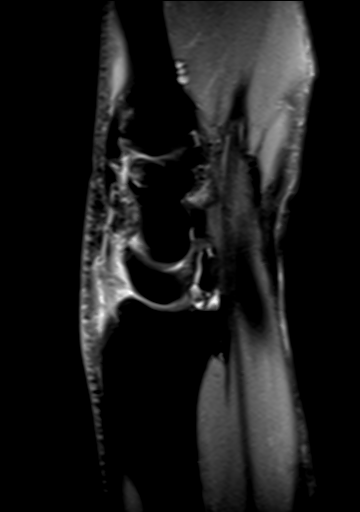
[im 12/21]
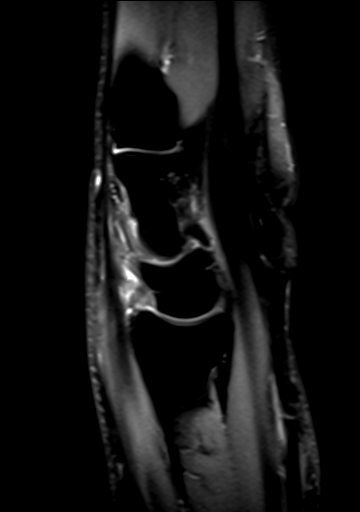
[im 15/21]
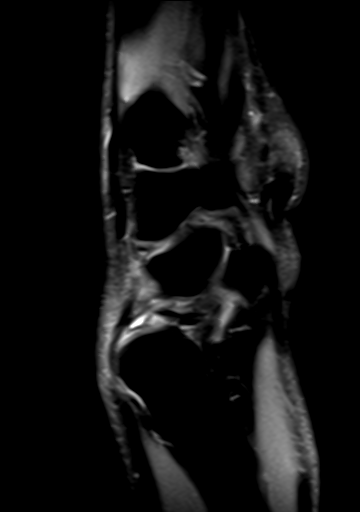
[im 18/21]
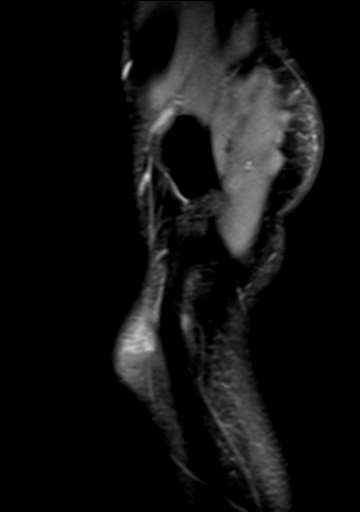
[im 21/21]
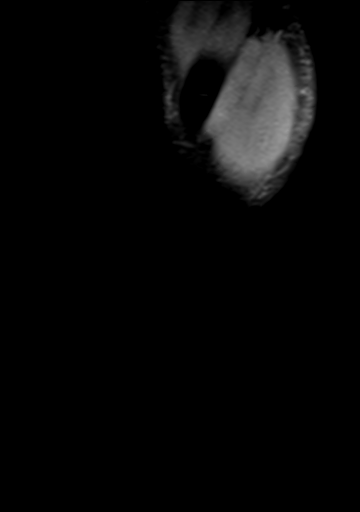

[40 of 40 positions shown; findings below may reference images not displayed]

FINDINGS: Ligaments: Intact.

Triangular fibrocartilage: Intact.

Tendons: Intact.

Carpal tunnel/median nerve: Normal.

Guyon's canal: Normal.

Joint/cartilage: Normal.

Bones/carpal alignment: Normal. Loss of fat saturation at the base
of the first metacarpal is incidentally noted.

Other: A cyst contiguous with the volar side of the radial styloid
measuring 1.5 cm transverse by 0.5 cm AP by 0.8 cm craniocaudal is
consistent with a ganglion.
IMPRESSION: No acute abnormality. Negative for ligament or TFC tear. No bony
abnormality.

Ganglion cyst contiguous with the volar aspect of the radial
styloid.

## 2021-02-06 ENCOUNTER — Encounter: Payer: Self-pay | Admitting: Emergency Medicine

## 2021-02-06 ENCOUNTER — Emergency Department (INDEPENDENT_AMBULATORY_CARE_PROVIDER_SITE_OTHER)
Admission: EM | Admit: 2021-02-06 | Discharge: 2021-02-06 | Disposition: A | Discharge status: Home / Self Care | Payer: BC Managed Care – PPO | Source: Home / Self Care | Attending: Family Medicine | Admitting: Family Medicine

## 2021-02-06 ENCOUNTER — Other Ambulatory Visit: Payer: Self-pay

## 2021-02-06 DIAGNOSIS — J101 Influenza due to other identified influenza virus with other respiratory manifestations: Secondary | ICD-10-CM | POA: Diagnosis not present

## 2021-02-06 LAB — POCT INFLUENZA A/B
Influenza A, POC: NEGATIVE
Influenza B, POC: POSITIVE — AB

## 2021-02-06 LAB — POC SARS CORONAVIRUS 2 AG -  ED: SARS Coronavirus 2 Ag: NEGATIVE

## 2021-02-06 MED ORDER — OSELTAMIVIR PHOSPHATE 75 MG PO CAPS: 75.0000 mg | ORAL_CAPSULE | Freq: Two times a day (BID) | ORAL | 0 refills | Status: AC

## 2021-02-06 NOTE — ED Triage Notes (Signed)
Cough, sore throat, chills, diarrhea started yesterday  Nausea started yesterday  No one else is sick at home Tylenol 1gm at 0730 No flu vaccine  Also has a bruise to left arm from a blood draw one week ago w/ pain & tingling per pt

## 2021-02-06 NOTE — ED Provider Notes (Signed)
[name]    CSN: [id number] Arrival date & time: 02/06/21  3016      History   Chief Complaint Chief Complaint  Patient presents with   Cough   Chills    HPI Ashley Whitaker is a 35 y.o. female.   HPI Was at a family gathering 3 days ago and towards the end of the evening her parents became ill.  She states that yesterday she started suddenly with cough sore throat chills, diarrhea and nausea.  She has had fever body aches and fatigue.  Did not get flu vaccinated.  Also would like to talk about a bruise on her arm from giving blood about a week ago.  History reviewed. No pertinent past medical history.  Patient Active Problem List   Diagnosis Date Noted   Rotator cuff impingement syndrome of right shoulder 09/24/2018   Left foot pain 05/28/2017   Pain in finger of both hands 05/28/2017   Cervical intraepithelial neoplasia grade 1 12/14/2016   Malignant melanoma ([place]) 12/14/2016   Right foot pain 12/14/2016   Low back pain 05/20/2013    Past Surgical History:  Procedure Laterality Date   ABLATION     APPENDECTOMY      OB History   No obstetric history on file.      Home Medications    Prior to Admission medications   Medication Sig Start Date End Date Taking? Authorizing Provider  oseltamivir (TAMIFLU) 75 MG capsule Take 1 capsule (75 mg total) by mouth every 12 (twelve) hours. 02/06/21  Yes Raylene Everts, MD  albuterol (VENTOLIN HFA) 108 (90 Base) MCG/ACT inhaler Inhale 2 puffs into the lungs every 6 (six) hours as needed. 01/28/21   [provider]  Multiple Vitamin (MULTIVITAMIN) tablet Take by mouth.    [provider]    Family History Family History  Problem Relation Age of Onset   Hyperlipidemia Father    Hypertension Father    Hypertension Mother    Diabetes Neg Hx    Heart attack Neg Hx    Sudden death Neg Hx     Social History Social History   Tobacco Use   Smoking status: Never   Smokeless  tobacco: Never  Vaping Use   Vaping Use: Never used  Substance Use Topics   Alcohol use: Yes    Alcohol/week: 3.0 standard drinks    Types: 3 Standard drinks or equivalent per week   Drug use: Never     Allergies   Sulfa antibiotics, Chlorophyll, Citric acid, and Lidocaine   Review of Systems Review of Systems See HPI  Physical Exam Triage Vital Signs ED Triage Vitals  Enc Vitals Group     BP 02/06/21 0846 112/72     Pulse Rate 02/06/21 0846 95     Resp 02/06/21 0846 15     Temp 02/06/21 0846 99.8 F (37.7 C)     Temp Source 02/06/21 0846 Oral     SpO2 02/06/21 0846 98 %     Weight 02/06/21 0847 150 lb (68 kg)     Height 02/06/21 0847 5\' 10"  (1.778 m)     Head Circumference --      Peak Flow --      Pain Score 02/06/21 0847 4     Pain Loc --      Pain Edu? --      Excl. in [place]? --    No data found.  Updated Vital Signs BP 112/72 (BP Location: Right  Arm)   Pulse 95   Temp 99.8 F (37.7 C) (Oral)   Resp 15   Ht 5\' 10"  (1.778 m)   Wt 68 kg   SpO2 98%   BMI 21.52 kg/m      Physical Exam Constitutional:      General: She is not in acute distress.    Appearance: She is well-developed. She is ill-appearing.  HENT:     Head: Normocephalic and atraumatic.     Right Ear: Tympanic membrane and ear canal normal.     Left Ear: Tympanic membrane and ear canal normal.     Nose: Nose normal.     Mouth/Throat:     Mouth: Mucous membranes are moist.     Pharynx: No posterior oropharyngeal erythema.  Eyes:     Conjunctiva/sclera: Conjunctivae normal.     Pupils: Pupils are equal, round, and reactive to light.  Cardiovascular:     Rate and Rhythm: Normal rate and regular rhythm.     Heart sounds: Normal heart sounds.  Pulmonary:     Effort: Pulmonary effort is normal. No respiratory distress.     Breath sounds: Normal breath sounds.  Abdominal:     General: There is no distension.     Palpations: Abdomen is soft.  Musculoskeletal:        General: Normal range  of motion.     Cervical back: Normal range of motion.  Skin:    General: Skin is warm and dry.  Neurological:     Mental Status: She is alert.     UC Treatments / Results  Labs (all labs ordered are listed, but only abnormal results are displayed) Labs Reviewed  POCT INFLUENZA A/B - Abnormal; Notable for the following components:      Result Value   Influenza B, POC Positive (*)    All other components within normal limits  POC SARS CORONAVIRUS 2 AG -  ED    EKG   Radiology No results found.  Procedures Procedures (including critical care time)  Medications Ordered in UC Medications - No data to display  Initial Impression / Assessment and Plan / UC Course  I have reviewed the triage vital signs and the nursing notes.  Pertinent labs & imaging results that were available during my care of the patient were reviewed by me and considered in my medical decision making (see chart for details).     Discussed influenza B.  Patient desires Tamiflu.  Discussed potential side effects: Risk versus benefits.  Return as needed Final Clinical Impressions(s) / UC Diagnoses   Final diagnoses:  Influenza B     Discharge Instructions      Take Tamiflu 2 times a day for 5 days Take 2 doses today Drink lots of fluids.  Take over-the-counter cough and cold medicines as needed You may return to activity when your symptoms have improved and you have no fever for 24 hours     ED Prescriptions     Medication Sig Dispense Auth. Provider   oseltamivir (TAMIFLU) 75 MG capsule Take 1 capsule (75 mg total) by mouth every 12 (twelve) hours. 10 capsule Raylene Everts, MD      PDMP not reviewed this encounter.   Raylene Everts, MD 02/06/21 1012

## 2021-02-06 NOTE — Discharge Instructions (Signed)
Take Tamiflu 2 times a day for 5 days Take 2 doses today Drink lots of fluids.  Take over-the-counter cough and cold medicines as needed You may return to activity when your symptoms have improved and you have no fever for 24 hours

## 2021-03-06 ENCOUNTER — Emergency Department: Admit: 2021-03-06 | Discharge status: Absent without leave | Payer: Self-pay

## 2021-03-06 ENCOUNTER — Other Ambulatory Visit: Payer: Self-pay

## 2021-03-06 ENCOUNTER — Emergency Department (INDEPENDENT_AMBULATORY_CARE_PROVIDER_SITE_OTHER)
Admission: EM | Admit: 2021-03-06 | Discharge: 2021-03-06 | Disposition: A | Discharge status: Home / Self Care | Payer: BC Managed Care – PPO | Source: Home / Self Care

## 2021-03-06 DIAGNOSIS — J069 Acute upper respiratory infection, unspecified: Secondary | ICD-10-CM

## 2021-03-06 DIAGNOSIS — Z20822 Contact with and (suspected) exposure to covid-19: Secondary | ICD-10-CM

## 2021-03-06 LAB — POC SARS CORONAVIRUS 2 AG -  ED: SARS Coronavirus 2 Ag: NEGATIVE

## 2021-03-06 LAB — POCT INFLUENZA A/B
Influenza A, POC: NEGATIVE
Influenza B, POC: NEGATIVE

## 2021-03-06 NOTE — ED Triage Notes (Signed)
Pt states that she has some fatigue, nausea, tingling of arm, dizziness and some nasal congestion. X2 days  Pt states that she recently had the Flu x1 month ago and her symptoms feels the same.   Pt states that she is vaccinated for covid. Pt states that she hasn't had flu vaccine.

## 2021-03-06 NOTE — ED Provider Notes (Signed)
[name]    CSN: [id number] Arrival date & time: 03/06/21  1444      History   Chief Complaint Chief Complaint  Patient presents with   Fatigue    Fatigue, nausea, tingling of arm, dizziness, and nasal congestion. X2 days    HPI Ashley Whitaker is a 35 y.o. female.   HPI  Patient had flu last month.  She states that she got completely better.  Now she has some fatigue nausea and nasal congestion.  She has been going on for 2 days.  She states her symptoms feel the same.  She did not get a flu vaccination. History reviewed. No pertinent past medical history.  Patient Active Problem List   Diagnosis Date Noted   Rotator cuff impingement syndrome of right shoulder 09/24/2018   Left foot pain 05/28/2017   Pain in finger of both hands 05/28/2017   Cervical intraepithelial neoplasia grade 1 12/14/2016   Malignant melanoma ([place]) 12/14/2016   Right foot pain 12/14/2016   Low back pain 05/20/2013    Past Surgical History:  Procedure Laterality Date   ABLATION     APPENDECTOMY      OB History   No obstetric history on file.      Home Medications    Prior to Admission medications   Medication Sig Start Date End Date Taking? Authorizing Provider  albuterol (VENTOLIN HFA) 108 (90 Base) MCG/ACT inhaler Inhale 2 puffs into the lungs every 6 (six) hours as needed. 01/28/21  Yes [provider]  Multiple Vitamin (MULTIVITAMIN) tablet Take by mouth.   Yes [provider]  oseltamivir (TAMIFLU) 75 MG capsule Take 1 capsule (75 mg total) by mouth every 12 (twelve) hours. 02/06/21   Raylene Everts, MD    Family History Family History  Problem Relation Age of Onset   Hyperlipidemia Father    Hypertension Father    Hypertension Mother    Diabetes Neg Hx    Heart attack Neg Hx    Sudden death Neg Hx     Social History Social History   Tobacco Use   Smoking status: Never   Smokeless tobacco: Never  Vaping Use   Vaping Use: Never  used  Substance Use Topics   Alcohol use: Yes    Alcohol/week: 3.0 standard drinks    Types: 3 Standard drinks or equivalent per week   Drug use: Never     Allergies   Sulfa antibiotics, Chlorophyll, Citric acid, and Lidocaine   Review of Systems Review of Systems See HPI  Physical Exam Triage Vital Signs ED Triage Vitals  Enc Vitals Group     BP 03/06/21 1459 111/73     Pulse Rate 03/06/21 1459 70     Resp 03/06/21 1459 18     Temp 03/06/21 1459 98.5 F (36.9 C)     Temp Source 03/06/21 1459 Oral     SpO2 03/06/21 1459 100 %     Weight 03/06/21 1458 150 lb (68 kg)     Height 03/06/21 1458 5\' 10"  (1.778 m)     Head Circumference --      Peak Flow --      Pain Score 03/06/21 1457 0     Pain Loc --      Pain Edu? --      Excl. in [place]? --    No data found.  Updated Vital Signs BP 111/73    Pulse 70    Temp 98.5 F (36.9 C) (  Oral)    Resp 18    Ht 5\' 10"  (1.778 m)    Wt 68 kg    SpO2 100%    BMI 21.52 kg/m  :     Physical Exam Constitutional:      General: She is not in acute distress.    Appearance: Normal appearance. She is well-developed. She is ill-appearing.  HENT:     Head: Normocephalic and atraumatic.     Right Ear: Tympanic membrane and ear canal normal.     Left Ear: Tympanic membrane and ear canal normal.     Mouth/Throat:     Pharynx: No posterior oropharyngeal erythema.  Eyes:     Conjunctiva/sclera: Conjunctivae normal.     Pupils: Pupils are equal, round, and reactive to light.  Cardiovascular:     Rate and Rhythm: Normal rate.     Heart sounds: Normal heart sounds.  Pulmonary:     Effort: Pulmonary effort is normal. No respiratory distress.  Abdominal:     General: There is no distension.     Palpations: Abdomen is soft.  Musculoskeletal:        General: Normal range of motion.     Cervical back: Normal range of motion.  Skin:    General: Skin is warm and dry.  Neurological:     Mental Status: She is alert.     UC Treatments /  Results  Labs (all labs ordered are listed, but only abnormal results are displayed) Labs Reviewed  POCT INFLUENZA A/B  POC SARS CORONAVIRUS 2 AG -  ED    EKG   Radiology No results found.  Procedures Procedures (including critical care time)  Medications Ordered in UC Medications - No data to display  Initial Impression / Assessment and Plan / UC Course  I have reviewed the triage vital signs and the nursing notes.  Pertinent labs & imaging results that were available during my care of the patient were reviewed by me and considered in my medical decision making (see chart for details).     Final Clinical Impressions(s) / UC Diagnoses   Final diagnoses:  Encounter for laboratory testing for COVID-19 virus  Viral URI     Discharge Instructions      Rest, push fluids, over-the-counter cough and cold medicines as needed   ED Prescriptions   None    PDMP not reviewed this encounter.   Raylene Everts, MD 03/06/21 [phone]

## 2021-03-06 NOTE — Discharge Instructions (Signed)
Rest, push fluids, over-the-counter cough and cold medicines as needed
